# Patient Record
Sex: Female | Born: 1965
Health system: Southern US, Community
[De-identification: ages and names within clinical notes are randomized; demographics above are authoritative.]

## PROBLEM LIST (undated history)

## (undated) DIAGNOSIS — E559 Vitamin D deficiency, unspecified: Secondary | ICD-10-CM

## (undated) DIAGNOSIS — F419 Anxiety disorder, unspecified: Secondary | ICD-10-CM

## (undated) DIAGNOSIS — F329 Major depressive disorder, single episode, unspecified: Secondary | ICD-10-CM

## (undated) DIAGNOSIS — G4700 Insomnia, unspecified: Secondary | ICD-10-CM

## (undated) DIAGNOSIS — F32A Depression, unspecified: Secondary | ICD-10-CM

## (undated) DIAGNOSIS — N941 Unspecified dyspareunia: Secondary | ICD-10-CM

## (undated) HISTORY — DX: Major depressive disorder, single episode, unspecified: F32.9

## (undated) HISTORY — DX: Depression, unspecified: F32.A

## (undated) HISTORY — DX: Unspecified dyspareunia: N94.10

## (undated) HISTORY — DX: Vitamin D deficiency, unspecified: E55.9

## (undated) HISTORY — DX: Anxiety disorder, unspecified: F41.9

## (undated) HISTORY — DX: Insomnia, unspecified: G47.00

---

## 2014-12-06 DIAGNOSIS — G4700 Insomnia, unspecified: Secondary | ICD-10-CM | POA: Insufficient documentation

## 2014-12-06 DIAGNOSIS — F419 Anxiety disorder, unspecified: Secondary | ICD-10-CM | POA: Insufficient documentation

## 2014-12-08 ENCOUNTER — Ambulatory Visit (INDEPENDENT_AMBULATORY_CARE_PROVIDER_SITE_OTHER): Payer: Self-pay | Admitting: Physician Assistant

## 2014-12-08 ENCOUNTER — Encounter: Payer: Self-pay | Admitting: Physician Assistant

## 2014-12-08 VITALS — BP 115/60 | HR 72 | Temp 97.7°F | Resp 16 | Wt 139.0 lb

## 2014-12-08 DIAGNOSIS — F419 Anxiety disorder, unspecified: Secondary | ICD-10-CM

## 2014-12-08 DIAGNOSIS — G4700 Insomnia, unspecified: Secondary | ICD-10-CM

## 2014-12-08 MED ORDER — TRAZODONE HCL 150 MG PO TABS: 150.0000 mg | ORAL_TABLET | Freq: Every day | ORAL | Status: DC

## 2014-12-08 MED ORDER — ESCITALOPRAM OXALATE 20 MG PO TABS: 20.0000 mg | ORAL_TABLET | Freq: Every day | ORAL | Status: DC

## 2014-12-08 NOTE — Progress Notes (Signed)
   Subjective:    Patient ID: Linda Ali, female    DOB: 12/17/1965, 49 y.o.   MRN: [id number]  Anxiety Presents for follow-up visit. Symptoms include insomnia and nervous/anxious behavior. Patient reports no chest pain, compulsions, confusion, decreased concentration, depressed mood, dizziness, dry mouth, excessive worry, feeling of choking, hyperventilation, impotence, irritability, malaise, muscle tension, nausea, obsessions, palpitations, panic, restlessness, shortness of breath or suicidal ideas. Symptoms occur rarely. The severity of symptoms is mild. The quality of sleep is good (with trazodone). Nighttime awakenings: none.   Compliance with medications is 76-100%.      Review of Systems  Constitutional: Negative for fever, chills, appetite change, irritability, fatigue and unexpected weight change.  Eyes: Negative for visual disturbance.  Respiratory: Negative for choking, chest tightness, shortness of breath and wheezing.   Cardiovascular: Negative for chest pain and palpitations.  Gastrointestinal: Negative for nausea and abdominal pain.  Genitourinary: Negative for impotence.  Neurological: Negative for dizziness, seizures, syncope, weakness, light-headedness and numbness.  Psychiatric/Behavioral: Negative for suicidal ideas, hallucinations, confusion, sleep disturbance, dysphoric mood, decreased concentration and agitation. The patient is nervous/anxious and has insomnia. The patient is not hyperactive.   All other systems reviewed and are negative.      Objective:   Physical Exam  Constitutional: She is oriented to person, place, and time. She appears well-developed and well-nourished. No distress.  Cardiovascular: Normal rate, regular rhythm, normal heart sounds and intact distal pulses.  Exam reveals no gallop and no friction rub.   No murmur heard. Pulmonary/Chest: Effort normal and breath sounds normal. No respiratory distress. She has no wheezes. She has no rales.  She exhibits no tenderness.  Neurological: She is alert and oriented to person, place, and time.  Skin: She is not diaphoretic.  Psychiatric: She has a normal mood and affect. Her behavior is normal. Judgment and thought content normal.  Vitals reviewed.         Assessment & Plan:  1. Anxiety Stable on lexapro.  Rx refilled.  Continune current medical plan - escitalopram (LEXAPRO) 20 MG tablet; Take 1 tablet (20 mg total) by mouth daily.  Dispense: 90 tablet; Refill: 3  2. Cannot sleep Stable and well controlled on trazadone.  Rx refilled.  COntinue current medical treatment plan. - traZODone (DESYREL) 150 MG tablet; Take 1 tablet (150 mg total) by mouth at bedtime.  Dispense: 90 tablet; Refill: 3

## 2014-12-08 NOTE — Patient Instructions (Signed)
Insomnia  Insomnia is frequent trouble falling and/or staying asleep. Insomnia can be a long term problem or a short term problem. Both are common. Insomnia can be a short term problem when the wakefulness is related to a certain stress or worry. Long term insomnia is often related to ongoing stress during waking hours and/or poor sleeping habits. Overtime, sleep deprivation itself can make the problem worse. Every little thing feels more severe because you are overtired and your ability to cope is decreased.  CAUSES    Stress, anxiety, and depression.   Poor sleeping habits.   Distractions such as TV in the bedroom.   Naps close to bedtime.   Engaging in emotionally charged conversations before bed.   Technical reading before sleep.   Alcohol and other sedatives. They may make the problem worse. They can hurt normal sleep patterns and normal dream activity.   Stimulants such as caffeine for several hours prior to bedtime.   Pain syndromes and shortness of breath can cause insomnia.   Exercise late at night.   Changing time zones may cause sleeping problems (jet lag).  It is sometimes helpful to have someone observe your sleeping patterns. They should look for periods of not breathing during the night (sleep apnea). They should also look to see how long those periods last. If you live alone or observers are uncertain, you can also be observed at a sleep clinic where your sleep patterns will be professionally monitored. Sleep apnea requires a checkup and treatment. Give your caregivers your medical history. Give your caregivers observations your family has made about your sleep.   SYMPTOMS    Not feeling rested in the morning.   Anxiety and restlessness at bedtime.   Difficulty falling and staying asleep.  TREATMENT    Your caregiver may prescribe treatment for an underlying medical disorders. Your caregiver can give advice or help if you are using alcohol or other drugs for self-medication. Treatment  of underlying problems will usually eliminate insomnia problems.   Medications can be prescribed for short time use. They are generally not recommended for lengthy use.   Over-the-counter sleep medicines are not recommended for lengthy use. They can be habit forming.   You can promote easier sleeping by making lifestyle changes such as:   Using relaxation techniques that help with breathing and reduce muscle tension.   Exercising earlier in the day.   Changing your diet and the time of your last meal. No night time snacks.   Establish a regular time to go to bed.   Counseling can help with stressful problems and worry.   Soothing music and white noise may be helpful if there are background noises you cannot remove.   Stop tedious detailed work at least one hour before bedtime.  HOME CARE INSTRUCTIONS    Keep a diary. Inform your caregiver about your progress. This includes any medication side effects. See your caregiver regularly. Take note of:   Times when you are asleep.   Times when you are awake during the night.   The quality of your sleep.   How you feel the next day.  This information will help your caregiver care for you.   Get out of bed if you are still awake after 15 minutes. Read or do some quiet activity. Keep the lights down. Wait until you feel sleepy and go back to bed.   Keep regular sleeping and waking hours. Avoid naps.   Exercise regularly.   Avoid distractions   at bedtime. Distractions include watching television or engaging in any intense or detailed activity like attempting to balance the household checkbook.   Develop a bedtime ritual. Keep a familiar routine of bathing, brushing your teeth, climbing into bed at the same time each night, listening to soothing music. Routines increase the success of falling to sleep faster.   Use relaxation techniques. This can be using breathing and muscle tension release routines. It can also include visualizing peaceful scenes. You can  also help control troubling or intruding thoughts by keeping your mind occupied with boring or repetitive thoughts like the old concept of counting sheep. You can make it more creative like imagining planting one beautiful flower after another in your backyard garden.   During your day, work to eliminate stress. When this is not possible use some of the previous suggestions to help reduce the anxiety that accompanies stressful situations.  MAKE SURE YOU:    Understand these instructions.   Will watch your condition.   Will get help right away if you are not doing well or get worse.  Document Released: 05/30/2000 Document Revised: 08/25/2011 Document Reviewed: 06/30/2007  ExitCare Patient Information 2015 [employer]. This information is not intended to replace advice given to you by your health care provider. Make sure you discuss any questions you have with your health care provider.  Generalized Anxiety Disorder  Generalized anxiety disorder (GAD) is a mental disorder. It interferes with life functions, including relationships, work, and school.  GAD is different from normal anxiety, which everyone experiences at some point in their lives in response to specific life events and activities. Normal anxiety actually helps us prepare for and get through these life events and activities. Normal anxiety goes away after the event or activity is over.   GAD causes anxiety that is not necessarily related to specific events or activities. It also causes excess anxiety in proportion to specific events or activities. The anxiety associated with GAD is also difficult to control. GAD can vary from mild to severe. People with severe GAD can have intense waves of anxiety with physical symptoms (panic attacks).   SYMPTOMS  The anxiety and worry associated with GAD are difficult to control. This anxiety and worry are related to many life events and activities and also occur more days than not for 6 months or longer. People  with GAD also have three or more of the following symptoms (one or more in children):   Restlessness.    Fatigue.   Difficulty concentrating.    Irritability.   Muscle tension.   Difficulty sleeping or unsatisfying sleep.  DIAGNOSIS  GAD is diagnosed through an assessment by your health care provider. Your health care provider will ask you questions aboutyour mood,physical symptoms, and events in your life. Your health care provider may ask you about your medical history and use of alcohol or drugs, including prescription medicines. Your health care provider may also do a physical exam and blood tests. Certain medical conditions and the use of certain substances can cause symptoms similar to those associated with GAD. Your health care provider may refer you to a mental health specialist for further evaluation.  TREATMENT  The following therapies are usually used to treat GAD:    Medication. Antidepressant medication usually is prescribed for long-term daily control. Antianxiety medicines may be added in severe cases, especially when panic attacks occur.    Talk therapy (psychotherapy). Certain types of talk therapy can be helpful in treating GAD by providing   support, education, and guidance. A form of talk therapy called cognitive behavioral therapy can teach you healthy ways to think about and react to daily life events and activities.   Stress managementtechniques. These include yoga, meditation, and exercise and can be very helpful when they are practiced regularly.  A mental health specialist can help determine which treatment is best for you. Some people see improvement with one therapy. However, other people require a combination of therapies.  Document Released: 09/27/2012 Document Revised: 10/17/2013 Document Reviewed: 09/27/2012  ExitCare Patient Information 2015 [employer]. This information is not intended to replace advice given to you by your health care provider. Make sure you  discuss any questions you have with your health care provider.

## 2014-12-29 ENCOUNTER — Ambulatory Visit: Payer: Self-pay | Admitting: Obstetrics and Gynecology

## 2015-01-18 ENCOUNTER — Encounter: Payer: Self-pay | Admitting: Obstetrics and Gynecology

## 2015-07-20 ENCOUNTER — Telehealth: Payer: Self-pay | Admitting: Physician Assistant

## 2015-07-20 DIAGNOSIS — G4700 Insomnia, unspecified: Secondary | ICD-10-CM

## 2015-07-20 NOTE — Telephone Encounter (Signed)
Pt called saying the pharmacy [facility] [employer] called her and told her that her last 63 of the trazadone had been denied and that she needed to call her primary doctor.  She doesn't understand why .  She said that seemed weird to her because she had just picked up 30 days yesterday.  Please call pt at [phone]  Thanks teri

## 2015-07-21 NOTE — Telephone Encounter (Signed)
Should we call the pharmacy? Please advise. Thanks!

## 2015-07-23 MED ORDER — TRAZODONE HCL 150 MG PO TABS: 150.0000 mg | ORAL_TABLET | Freq: Every day | ORAL | Status: DC

## 2015-07-23 NOTE — Telephone Encounter (Signed)
Rx sent in

## 2015-07-23 NOTE — Telephone Encounter (Signed)
Advised patient as below.  

## 2015-11-28 ENCOUNTER — Telehealth: Payer: Self-pay | Admitting: Physician Assistant

## 2015-11-28 DIAGNOSIS — G4700 Insomnia, unspecified: Secondary | ICD-10-CM

## 2015-11-28 MED ORDER — TRAZODONE HCL 150 MG PO TABS
150.0000 mg | ORAL_TABLET | Freq: Every day | ORAL | Status: DC
Start: 2015-11-28 — End: 2015-12-05

## 2015-11-28 NOTE — Telephone Encounter (Signed)
Please review.  Thanks,  -Treven Holtman 

## 2015-11-28 NOTE — Telephone Encounter (Signed)
Sent in Rx for one month to [facility].

## 2015-11-28 NOTE — Telephone Encounter (Signed)
Pt states she is out of traZODone (DESYREL) 150 MG tablet.  She made an appt for 6/21 and would like to know if you would call in an RX to hold her over until her appt.

## 2015-12-05 ENCOUNTER — Encounter: Payer: Self-pay | Admitting: Physician Assistant

## 2015-12-05 ENCOUNTER — Ambulatory Visit (INDEPENDENT_AMBULATORY_CARE_PROVIDER_SITE_OTHER): Payer: BLUE CROSS/BLUE SHIELD | Admitting: Physician Assistant

## 2015-12-05 VITALS — BP 110/80 | HR 71 | Temp 98.2°F | Resp 16 | Wt 149.4 lb

## 2015-12-05 DIAGNOSIS — J069 Acute upper respiratory infection, unspecified: Secondary | ICD-10-CM

## 2015-12-05 DIAGNOSIS — K120 Recurrent oral aphthae: Secondary | ICD-10-CM

## 2015-12-05 DIAGNOSIS — G4700 Insomnia, unspecified: Secondary | ICD-10-CM

## 2015-12-05 DIAGNOSIS — F411 Generalized anxiety disorder: Secondary | ICD-10-CM | POA: Diagnosis not present

## 2015-12-05 MED ORDER — TRAZODONE HCL 150 MG PO TABS: 150.0000 mg | ORAL_TABLET | Freq: Every day | ORAL | Status: DC

## 2015-12-05 MED ORDER — AMOXICILLIN 400 MG/5ML PO SUSR: 400.0000 mg | Freq: Two times a day (BID) | ORAL | Status: DC

## 2015-12-05 MED ORDER — SERTRALINE HCL 50 MG PO TABS: 50.0000 mg | ORAL_TABLET | Freq: Every day | ORAL | Status: DC

## 2015-12-05 NOTE — Patient Instructions (Signed)
Sertraline tablets What is this medicine? SERTRALINE (SER tra leen) is used to treat depression. It may also be used to treat obsessive compulsive disorder, panic disorder, post-trauma stress, premenstrual dysphoric disorder (PMDD) or social anxiety. This medicine may be used for other purposes; ask your health care provider or pharmacist if you have questions. What should I tell my health care provider before I take this medicine? They need to know if you have any of these conditions: -bipolar disorder or a family history of bipolar disorder -diabetes -glaucoma -heart disease -high blood pressure -history of irregular heartbeat -history of low levels of calcium, magnesium, or potassium in the blood -if you often drink alcohol -liver disease -receiving electroconvulsive therapy -seizures -suicidal thoughts, plans, or attempt; a previous suicide attempt by you or a family member -thyroid disease -an unusual or allergic reaction to sertraline, other medicines, foods, dyes, or preservatives -pregnant or trying to get pregnant -breast-feeding How should I use this medicine? Take this medicine by mouth with a glass of water. Follow the directions on the prescription label. You can take it with or without food. Take your medicine at regular intervals. Do not take your medicine more often than directed. Do not stop taking this medicine suddenly except upon the advice of your doctor. Stopping this medicine too quickly may cause serious side effects or your condition may worsen. A special MedGuide will be given to you by the pharmacist with each prescription and refill. Be sure to read this information carefully each time. Talk to your pediatrician regarding the use of this medicine in children. While this drug may be prescribed for children as young as 7 years for selected conditions, precautions do apply. Overdosage: If you think you have taken too much of this medicine contact a poison control  center or emergency room at once. NOTE: This medicine is only for you. Do not share this medicine with others. What if I miss a dose? If you miss a dose, take it as soon as you can. If it is almost time for your next dose, take only that dose. Do not take double or extra doses. What may interact with this medicine? Do not take this medicine with any of the following medications: -certain medicines for fungal infections like fluconazole, itraconazole, ketoconazole, posaconazole, voriconazole -cisapride -disulfiram -dofetilide -linezolid -MAOIs like Carbex, Eldepryl, Marplan, Nardil, and Parnate -metronidazole -methylene blue (injected into a vein) -pimozide -thioridazine -ziprasidone This medicine may also interact with the following medications: -alcohol -aspirin and aspirin-like medicines -certain medicines for depression, anxiety, or psychotic disturbances -certain medicines for irregular heart beat like flecainide, propafenone -certain medicines for migraine headaches like almotriptan, eletriptan, frovatriptan, naratriptan, rizatriptan, sumatriptan, zolmitriptan -certain medicines for sleep -certain medicines for seizures like carbamazepine, valproic acid, phenytoin -certain medicines that treat or prevent blood clots like warfarin, enoxaparin, dalteparin -cimetidine -digoxin -diuretics -fentanyl -furazolidone -isoniazid -lithium -NSAIDs, medicines for pain and inflammation, like ibuprofen or naproxen -other medicines that prolong the QT interval (cause an abnormal heart rhythm) -procarbazine -rasagiline -supplements like St. John's wort, kava kava, valerian -tolbutamide -tramadol -tryptophan This list may not describe all possible interactions. Give your health care provider a list of all the medicines, herbs, non-prescription drugs, or dietary supplements you use. Also tell them if you smoke, drink alcohol, or use illegal drugs. Some items may interact with your  medicine. What should I watch for while using this medicine? Tell your doctor if your symptoms do not get better or if they get worse. Visit your doctor   or health care professional for regular checks on your progress. Because it may take several weeks to see the full effects of this medicine, it is important to continue your treatment as prescribed by your doctor. Patients and their families should watch out for new or worsening thoughts of suicide or depression. Also watch out for sudden changes in feelings such as feeling anxious, agitated, panicky, irritable, hostile, aggressive, impulsive, severely restless, overly excited and hyperactive, or not being able to sleep. If this happens, especially at the beginning of treatment or after a change in dose, call your health care professional. You may get drowsy or dizzy. Do not drive, use machinery, or do anything that needs mental alertness until you know how this medicine affects you. Do not stand or sit up quickly, especially if you are an older patient. This reduces the risk of dizzy or fainting spells. Alcohol may interfere with the effect of this medicine. Avoid alcoholic drinks. Your mouth may get dry. Chewing sugarless gum or sucking hard candy, and drinking plenty of water may help. Contact your doctor if the problem does not go away or is severe. What side effects may I notice from receiving this medicine? Side effects that you should report to your doctor or health care professional as soon as possible: -allergic reactions like skin rash, itching or hives, swelling of the face, lips, or tongue -black or bloody stools, blood in the urine or vomit -fast, irregular heartbeat -feeling faint or lightheaded, falls -hallucination, loss of contact with reality -seizures -suicidal thoughts or other mood changes -unusual bleeding or bruising -unusually weak or tired -vomiting Side effects that usually do not require medical attention (report to your  doctor or health care professional if they continue or are bothersome): -change in appetite -change in sex drive or performance -diarrhea -increased sweating -indigestion, nausea -tremors This list may not describe all possible side effects. Call your doctor for medical advice about side effects. You may report side effects to FDA at 1-800-FDA-1088. Where should I keep my medicine? Keep out of the reach of children. Store at room temperature between 15 and 30 degrees C (59 and 86 degrees F). Throw away any unused medicine after the expiration date. NOTE: This sheet is a summary. It may not cover all possible information. If you have questions about this medicine, talk to your doctor, pharmacist, or health care provider.    2016, Elsevier/Gold Standard. (2012-12-28 12:57:35)  

## 2015-12-05 NOTE — Progress Notes (Signed)
Patient: Linda Ali Female    DOB: 1966-04-25   50 y.o.   MRN: [id number] Visit Date: 12/05/2015  Today's Provider: Margaretann Loveless, PA-C   Chief Complaint  Patient presents with  . Follow-up    Refill on Trazodone   Subjective:    HPI Cannot Sleep: She is here to follow-up on her sleep. She is doing well with the Trazodone and needs refill.  Ulcers: She reports that she being having this cold for a month is better than what it was but she feels very tired. She also has some ulcers in her mouth and her throat hurts and she is not sure if it is from the cold. She has also been under more stress recently with her parents moving down here from [place]. They are both in their 29s and they are also building them a condo here as well, adding to the stress.    No Known Allergies Current Meds  Medication Sig  . MULTIPLE VITAMIN PO Take 1 tablet by mouth daily.  . traZODone (DESYREL) 150 MG tablet Take 1 tablet (150 mg total) by mouth at bedtime.    Review of Systems  Constitutional: Positive for fever (off and on), chills and fatigue.  HENT: Positive for congestion, mouth sores, postnasal drip, rhinorrhea, sinus pressure, sneezing and sore throat (left side for past couple days). Negative for ear pain, tinnitus, trouble swallowing and voice change.   Respiratory: Positive for cough. Negative for chest tightness, shortness of breath and wheezing.   Cardiovascular: Negative for chest pain, palpitations and leg swelling.  Gastrointestinal: Negative for nausea, vomiting and abdominal pain.  Neurological: Positive for headaches. Negative for dizziness.  Psychiatric/Behavioral: Positive for sleep disturbance.    Social History  Substance Use Topics  . Smoking status: Never Smoker   . Smokeless tobacco: Not on file  . Alcohol Use: 0.0 oz/week    0 Standard drinks or equivalent per week     Comment: OCCASIONALLY   Objective:   BP 110/80 mmHg  Pulse 71  Temp(Src) 98.2 F (36.8  C) (Oral)  Resp 16  Wt 149 lb 6.4 oz (67.767 kg)  SpO2 98%  Physical Exam  Constitutional: She appears well-developed and well-nourished. No distress.  HENT:  Head: Normocephalic and atraumatic.  Right Ear: Hearing, tympanic membrane, external ear and ear canal normal.  Left Ear: Hearing, tympanic membrane, external ear and ear canal normal.  Nose: Mucosal edema present. No rhinorrhea. Right sinus exhibits no maxillary sinus tenderness and no frontal sinus tenderness. Left sinus exhibits no maxillary sinus tenderness and no frontal sinus tenderness.  Mouth/Throat: Uvula is midline and mucous membranes are normal. Oral lesions present. No uvula swelling. Posterior oropharyngeal erythema present. No oropharyngeal exudate or posterior oropharyngeal edema.    Eyes: Conjunctivae are normal. Pupils are equal, round, and reactive to light. Right eye exhibits no discharge. Left eye exhibits no discharge. No scleral icterus.  Neck: Normal range of motion. Neck supple. No JVD present. No tracheal deviation present. No thyromegaly present.  Cardiovascular: Normal rate, regular rhythm and normal heart sounds.  Exam reveals no gallop and no friction rub.   No murmur heard. Pulmonary/Chest: Effort normal and breath sounds normal. No stridor. No respiratory distress. She has no wheezes. She has no rales.  Lymphadenopathy:    She has no cervical adenopathy.  Skin: Skin is warm and dry. She is not diaphoretic.  Vitals reviewed.       Assessment & Plan:  1. GAD (generalized anxiety disorder) Lexapro started making her sick so will switch back to sertraline as she has used this in the past with success. She is to call if she does not tolerate this medicine or if she has any adverse reaction.  - sertraline (ZOLOFT) 50 MG tablet; Take 1 tablet (50 mg total) by mouth daily.  Dispense: 30 tablet; Refill: 3  2. Cannot sleep Stable. Diagnosis pulled for medication refill. Continue current medical  treatment plan. - traZODone (DESYREL) 150 MG tablet; Take 1 tablet (150 mg total) by mouth at bedtime.  Dispense: 30 tablet; Refill: 5  3. Upper respiratory infection URI that keeps coming and going with an aphthous ulcer that may be stress induced. Will give amoxicillin suspension as below to treat both. She may use orajel over area if needed to lessen pain. She is to call if symptoms fail to improve or worsen. - amoxicillin (AMOXIL) 400 MG/5ML suspension; Take 5 mLs (400 mg total) by mouth 2 (two) times daily.  Dispense: 100 mL; Refill: 0  4. Aphthous ulcer See above medical treatment plan. - amoxicillin (AMOXIL) 400 MG/5ML suspension; Take 5 mLs (400 mg total) by mouth 2 (two) times daily.  Dispense: 100 mL; Refill: 0       Margaretann LovelessJennifer M Percilla Tweten, PA-C  [facility]

## 2016-03-06 ENCOUNTER — Encounter: Payer: Self-pay | Admitting: Obstetrics and Gynecology

## 2016-03-06 ENCOUNTER — Other Ambulatory Visit: Payer: Self-pay | Admitting: Obstetrics and Gynecology

## 2016-03-06 ENCOUNTER — Ambulatory Visit (INDEPENDENT_AMBULATORY_CARE_PROVIDER_SITE_OTHER): Payer: BLUE CROSS/BLUE SHIELD | Admitting: Obstetrics and Gynecology

## 2016-03-06 VITALS — BP 119/78 | HR 96 | Ht 65.0 in | Wt 146.9 lb

## 2016-03-06 DIAGNOSIS — Z01419 Encounter for gynecological examination (general) (routine) without abnormal findings: Secondary | ICD-10-CM

## 2016-03-06 DIAGNOSIS — G4700 Insomnia, unspecified: Secondary | ICD-10-CM

## 2016-03-06 MED ORDER — PHENTERMINE HCL 37.5 MG PO TABS: 37.5000 mg | ORAL_TABLET | Freq: Every day | ORAL | 1 refills | Status: DC

## 2016-03-06 MED ORDER — TRAZODONE HCL 150 MG PO TABS: 150.0000 mg | ORAL_TABLET | Freq: Every day | ORAL | 5 refills | Status: DC

## 2016-03-06 MED ORDER — ESTRADIOL 0.1 MG/GM VA CREA: 2.0000 g | TOPICAL_CREAM | Freq: Every day | VAGINAL | 2 refills | Status: DC

## 2016-03-06 NOTE — Patient Instructions (Signed)
Preventive Care for Adults, Female A healthy lifestyle and preventive care can promote health and wellness. Preventive health guidelines for women include the following key practices.  A routine yearly physical is a good way to check with your health care provider about your health and preventive screening. It is a chance to share any concerns and updates on your health and to receive a thorough exam.  Visit your dentist for a routine exam and preventive care every 6 months. Brush your teeth twice a day and floss once a day. Good oral hygiene prevents tooth decay and gum disease.  The frequency of eye exams is based on your age, health, family medical history, use of contact lenses, and other factors. Follow your health care provider's recommendations for frequency of eye exams.  Eat a healthy diet. Foods like vegetables, fruits, whole grains, low-fat dairy products, and lean protein foods contain the nutrients you need without too many calories. Decrease your intake of foods high in solid fats, added sugars, and salt. Eat the right amount of calories for you.Get information about a proper diet from your health care provider, if necessary.  Regular physical exercise is one of the most important things you can do for your health. Most adults should get at least 150 minutes of moderate-intensity exercise (any activity that increases your heart rate and causes you to sweat) each week. In addition, most adults need muscle-strengthening exercises on 2 or more days a week.  Maintain a healthy weight. The body mass index (BMI) is a screening tool to identify possible weight problems. It provides an estimate of body fat based on height and weight. Your health care provider can find your BMI and can help you achieve or maintain a healthy weight.For adults 20 years and older:  A BMI below 18.5 is considered underweight.  A BMI of 18.5 to 24.9 is normal.  A BMI of 25 to 29.9 is considered  overweight.  A BMI of 30 and above is considered obese.  Maintain normal blood lipids and cholesterol levels by exercising and minimizing your intake of saturated fat. Eat a balanced diet with plenty of fruit and vegetables. Blood tests for lipids and cholesterol should begin at age 64 and be repeated every 5 years. If your lipid or cholesterol levels are high, you are over 50, or you are at high risk for heart disease, you may need your cholesterol levels checked more frequently.Ongoing high lipid and cholesterol levels should be treated with medicines if diet and exercise are not working.  If you smoke, find out from your health care provider how to quit. If you do not use tobacco, do not start.  Lung cancer screening is recommended for adults aged 52-80 years who are at high risk for developing lung cancer because of a history of smoking. A yearly low-dose CT scan of the lungs is recommended for people who have at least a 30-pack-year history of smoking and are a current smoker or have quit within the past 15 years. A pack year of smoking is smoking an average of 1 pack of cigarettes a day for 1 year (for example: 1 pack a day for 30 years or 2 packs a day for 15 years). Yearly screening should continue until the smoker has stopped smoking for at least 15 years. Yearly screening should be stopped for people who develop a health problem that would prevent them from having lung cancer treatment.  If you are pregnant, do not drink alcohol. If you are  breastfeeding, be very cautious about drinking alcohol. If you are not pregnant and choose to drink alcohol, do not have more than 1 drink per day. One drink is considered to be 12 ounces (355 mL) of beer, 5 ounces (148 mL) of wine, or 1.5 ounces (44 mL) of liquor.  Avoid use of street drugs. Do not share needles with anyone. Ask for help if you need support or instructions about stopping the use of drugs.  High blood pressure causes heart disease and  increases the risk of stroke. Your blood pressure should be checked at least every 1 to 2 years. Ongoing high blood pressure should be treated with medicines if weight loss and exercise do not work.  If you are 25-78 years old, ask your health care provider if you should take aspirin to prevent strokes.  Diabetes screening is done by taking a blood sample to check your blood glucose level after you have not eaten for a certain period of time (fasting). If you are not overweight and you do not have risk factors for diabetes, you should be screened once every 3 years starting at age 86. If you are overweight or obese and you are 3-87 years of age, you should be screened for diabetes every year as part of your cardiovascular risk assessment.  Breast cancer screening is essential preventive care for women. You should practice "breast self-awareness." This means understanding the normal appearance and feel of your breasts and may include breast self-examination. Any changes detected, no matter how small, should be reported to a health care provider. Women in their 66s and 30s should have a clinical breast exam (CBE) by a health care provider as part of a regular health exam every 1 to 3 years. After age 43, women should have a CBE every year. Starting at age 37, women should consider having a mammogram (breast X-ray test) every year. Women who have a family history of breast cancer should talk to their health care provider about genetic screening. Women at a high risk of breast cancer should talk to their health care providers about having an MRI and a mammogram every year.  Breast cancer gene (BRCA)-related cancer risk assessment is recommended for women who have family members with BRCA-related cancers. BRCA-related cancers include breast, ovarian, tubal, and peritoneal cancers. Having family members with these cancers may be associated with an increased risk for harmful changes (mutations) in the breast  cancer genes BRCA1 and BRCA2. Results of the assessment will determine the need for genetic counseling and BRCA1 and BRCA2 testing.  Your health care provider may recommend that you be screened regularly for cancer of the pelvic organs (ovaries, uterus, and vagina). This screening involves a pelvic examination, including checking for microscopic changes to the surface of your cervix (Pap test). You may be encouraged to have this screening done every 3 years, beginning at age 78.  For women ages 79-65, health care providers may recommend pelvic exams and Pap testing every 3 years, or they may recommend the Pap and pelvic exam, combined with testing for human papilloma virus (HPV), every 5 years. Some types of HPV increase your risk of cervical cancer. Testing for HPV may also be done on women of any age with unclear Pap test results.  Other health care providers may not recommend any screening for nonpregnant women who are considered low risk for pelvic cancer and who do not have symptoms. Ask your health care provider if a screening pelvic exam is right for  you.  If you have had past treatment for cervical cancer or a condition that could lead to cancer, you need Pap tests and screening for cancer for at least 20 years after your treatment. If Pap tests have been discontinued, your risk factors (such as having a new sexual partner) need to be reassessed to determine if screening should resume. Some women have medical problems that increase the chance of getting cervical cancer. In these cases, your health care provider may recommend more frequent screening and Pap tests.  Colorectal cancer can be detected and often prevented. Most routine colorectal cancer screening begins at the age of 50 years and continues through age 75 years. However, your health care provider may recommend screening at an earlier age if you have risk factors for colon cancer. On a yearly basis, your health care provider may provide  home test kits to check for hidden blood in the stool. Use of a small camera at the end of a tube, to directly examine the colon (sigmoidoscopy or colonoscopy), can detect the earliest forms of colorectal cancer. Talk to your health care provider about this at age 50, when routine screening begins. Direct exam of the colon should be repeated every 5-10 years through age 75 years, unless early forms of precancerous polyps or small growths are found.  People who are at an increased risk for hepatitis B should be screened for this virus. You are considered at high risk for hepatitis B if:  You were born in a country where hepatitis B occurs often. Talk with your health care provider about which countries are considered high risk.  Your parents were born in a high-risk country and you have not received a shot to protect against hepatitis B (hepatitis B vaccine).  You have HIV or AIDS.  You use needles to inject street drugs.  You live with, or have sex with, someone who has hepatitis B.  You get hemodialysis treatment.  You take certain medicines for conditions like cancer, organ transplantation, and autoimmune conditions.  Hepatitis C blood testing is recommended for all people born from 1945 through 1965 and any individual with known risks for hepatitis C.  Practice safe sex. Use condoms and avoid high-risk sexual practices to reduce the spread of sexually transmitted infections (STIs). STIs include gonorrhea, chlamydia, syphilis, trichomonas, herpes, HPV, and human immunodeficiency virus (HIV). Herpes, HIV, and HPV are viral illnesses that have no cure. They can result in disability, cancer, and death.  You should be screened for sexually transmitted illnesses (STIs) including gonorrhea and chlamydia if:  You are sexually active and are younger than 24 years.  You are older than 24 years and your health care provider tells you that you are at risk for this type of infection.  Your sexual  activity has changed since you were last screened and you are at an increased risk for chlamydia or gonorrhea. Ask your health care provider if you are at risk.  If you are at risk of being infected with HIV, it is recommended that you take a prescription medicine daily to prevent HIV infection. This is called preexposure prophylaxis (PrEP). You are considered at risk if:  You are sexually active and do not regularly use condoms or know the HIV status of your partner(s).  You take drugs by injection.  You are sexually active with a partner who has HIV.  Talk with your health care provider about whether you are at high risk of being infected with HIV. If   you choose to begin PrEP, you should first be tested for HIV. You should then be tested every 3 months for as long as you are taking PrEP.  Osteoporosis is a disease in which the bones lose minerals and strength with aging. This can result in serious bone fractures or breaks. The risk of osteoporosis can be identified using a bone density scan. Women ages 1 years and over and women at risk for fractures or osteoporosis should discuss screening with their health care providers. Ask your health care provider whether you should take a calcium supplement or vitamin D to reduce the rate of osteoporosis.  Menopause can be associated with physical symptoms and risks. Hormone replacement therapy is available to decrease symptoms and risks. You should talk to your health care provider about whether hormone replacement therapy is right for you.  Use sunscreen. Apply sunscreen liberally and repeatedly throughout the day. You should seek shade when your shadow is shorter than you. Protect yourself by wearing long sleeves, pants, a wide-brimmed hat, and sunglasses year round, whenever you are outdoors.  Once a month, do a whole body skin exam, using a mirror to look at the skin on your back. Tell your health care provider of new moles, moles that have irregular  borders, moles that are larger than a pencil eraser, or moles that have changed in shape or color.  Stay current with required vaccines (immunizations).  Influenza vaccine. All adults should be immunized every year.  Tetanus, diphtheria, and acellular pertussis (Td, Tdap) vaccine. Pregnant women should receive 1 dose of Tdap vaccine during each pregnancy. The dose should be obtained regardless of the length of time since the last dose. Immunization is preferred during the 27th-36th week of gestation. An adult who has not previously received Tdap or who does not know her vaccine status should receive 1 dose of Tdap. This initial dose should be followed by tetanus and diphtheria toxoids (Td) booster doses every 10 years. Adults with an unknown or incomplete history of completing a 3-dose immunization series with Td-containing vaccines should begin or complete a primary immunization series including a Tdap dose. Adults should receive a Td booster every 10 years.  Varicella vaccine. An adult without evidence of immunity to varicella should receive 2 doses or a second dose if she has previously received 1 dose. Pregnant females who do not have evidence of immunity should receive the first dose after pregnancy. This first dose should be obtained before leaving the health care facility. The second dose should be obtained 4-8 weeks after the first dose.  Human papillomavirus (HPV) vaccine. Females aged 13-26 years who have not received the vaccine previously should obtain the 3-dose series. The vaccine is not recommended for use in pregnant females. However, pregnancy testing is not needed before receiving a dose. If a female is found to be pregnant after receiving a dose, no treatment is needed. In that case, the remaining doses should be delayed until after the pregnancy. Immunization is recommended for any person with an immunocompromised condition through the age of 24 years if she did not get any or all doses  earlier. During the 3-dose series, the second dose should be obtained 4-8 weeks after the first dose. The third dose should be obtained 24 weeks after the first dose and 16 weeks after the second dose.  Zoster vaccine. One dose is recommended for adults aged 97 years or older unless certain conditions are present.  Measles, mumps, and rubella (MMR) vaccine. Adults born  before 1957 generally are considered immune to measles and mumps. Adults born in [birth year] or later should have 1 or more doses of MMR vaccine unless there is a contraindication to the vaccine or there is laboratory evidence of immunity to each of the three diseases. A routine second dose of MMR vaccine should be obtained at least 28 days after the first dose for students attending postsecondary schools, health care workers, or international travelers. People who received inactivated measles vaccine or an unknown type of measles vaccine during 1963-1967 should receive 2 doses of MMR vaccine. People who received inactivated mumps vaccine or an unknown type of mumps vaccine before 1979 and are at high risk for mumps infection should consider immunization with 2 doses of MMR vaccine. For females of childbearing age, rubella immunity should be determined. If there is no evidence of immunity, females who are not pregnant should be vaccinated. If there is no evidence of immunity, females who are pregnant should delay immunization until after pregnancy. Unvaccinated health care workers born before 60 who lack laboratory evidence of measles, mumps, or rubella immunity or laboratory confirmation of disease should consider measles and mumps immunization with 2 doses of MMR vaccine or rubella immunization with 1 dose of MMR vaccine.  Pneumococcal 13-valent conjugate (PCV13) vaccine. When indicated, a person who is uncertain of his immunization history and has no record of immunization should receive the PCV13 vaccine. All adults 61 years of age and older  should receive this vaccine. An adult aged 92 years or older who has certain medical conditions and has not been previously immunized should receive 1 dose of PCV13 vaccine. This PCV13 should be followed with a dose of pneumococcal polysaccharide (PPSV23) vaccine. Adults who are at high risk for pneumococcal disease should obtain the PPSV23 vaccine at least 8 weeks after the dose of PCV13 vaccine. Adults older than 51 years of age who have normal immune system function should obtain the PPSV23 vaccine dose at least 1 year after the dose of PCV13 vaccine.  Pneumococcal polysaccharide (PPSV23) vaccine. When PCV13 is also indicated, PCV13 should be obtained first. All adults aged 2 years and older should be immunized. An adult younger than age 30 years who has certain medical conditions should be immunized. Any person who resides in a nursing home or long-term care facility should be immunized. An adult smoker should be immunized. People with an immunocompromised condition and certain other conditions should receive both PCV13 and PPSV23 vaccines. People with human immunodeficiency virus (HIV) infection should be immunized as soon as possible after diagnosis. Immunization during chemotherapy or radiation therapy should be avoided. Routine use of PPSV23 vaccine is not recommended for American Indians, [place] Natives, or people younger than 65 years unless there are medical conditions that require PPSV23 vaccine. When indicated, people who have unknown immunization and have no record of immunization should receive PPSV23 vaccine. One-time revaccination 5 years after the first dose of PPSV23 is recommended for people aged 19-64 years who have chronic kidney failure, nephrotic syndrome, asplenia, or immunocompromised conditions. People who received 1-2 doses of PPSV23 before age 44 years should receive another dose of PPSV23 vaccine at age 83 years or later if at least 5 years have passed since the previous dose. Doses  of PPSV23 are not needed for people immunized with PPSV23 at or after age 20 years.  Meningococcal vaccine. Adults with asplenia or persistent complement component deficiencies should receive 2 doses of quadrivalent meningococcal conjugate (MenACWY-D) vaccine. The doses should be obtained  at least 2 months apart. Microbiologists working with certain meningococcal bacteria, [place] recruits, people at risk during an outbreak, and people who travel to or live in countries with a high rate of meningitis should be immunized. A first-year college student up through age 28 years who is living in a residence hall should receive a dose if she did not receive a dose on or after her 16th birthday. Adults who have certain high-risk conditions should receive one or more doses of vaccine.  Hepatitis A vaccine. Adults who wish to be protected from this disease, have certain high-risk conditions, work with hepatitis A-infected animals, work in hepatitis A research labs, or travel to or work in countries with a high rate of hepatitis A should be immunized. Adults who were previously unvaccinated and who anticipate close contact with an international adoptee during the first 60 days after arrival in the [place] from a country with a high rate of hepatitis A should be immunized.  Hepatitis B vaccine. Adults who wish to be protected from this disease, have certain high-risk conditions, may be exposed to blood or other infectious body fluids, are household contacts or sex partners of hepatitis B positive people, are clients or workers in certain care facilities, or travel to or work in countries with a high rate of hepatitis B should be immunized.  Haemophilus influenzae type b (Hib) vaccine. A previously unvaccinated person with asplenia or sickle cell disease or having a scheduled splenectomy should receive 1 dose of Hib vaccine. Regardless of previous immunization, a recipient of a hematopoietic stem cell transplant  should receive a 3-dose series 6-12 months after her successful transplant. Hib vaccine is not recommended for adults with HIV infection. Preventive Services / Frequency Ages 71 to 87 years  Blood pressure check.** / Every 3-5 years.  Lipid and cholesterol check.** / Every 5 years beginning at age 1.  Clinical breast exam.** / Every 3 years for women in their 3s and 31s.  BRCA-related cancer risk assessment.** / For women who have family members with a BRCA-related cancer (breast, ovarian, tubal, or peritoneal cancers).  Pap test.** / Every 2 years from ages 50 through 86. Every 3 years starting at age 87 through age 7 or 75 with a history of 3 consecutive normal Pap tests.  HPV screening.** / Every 3 years from ages 59 through ages 35 to 6 with a history of 3 consecutive normal Pap tests.  Hepatitis C blood test.** / For any individual with known risks for hepatitis C.  Skin self-exam. / Monthly.  Influenza vaccine. / Every year.  Tetanus, diphtheria, and acellular pertussis (Tdap, Td) vaccine.** / Consult your health care provider. Pregnant women should receive 1 dose of Tdap vaccine during each pregnancy. 1 dose of Td every 10 years.  Varicella vaccine.** / Consult your health care provider. Pregnant females who do not have evidence of immunity should receive the first dose after pregnancy.  HPV vaccine. / 3 doses over 6 months, if 72 and younger. The vaccine is not recommended for use in pregnant females. However, pregnancy testing is not needed before receiving a dose.  Measles, mumps, rubella (MMR) vaccine.** / You need at least 1 dose of MMR if you were born in 1957 or later. You may also need a 2nd dose. For females of childbearing age, rubella immunity should be determined. If there is no evidence of immunity, females who are not pregnant should be vaccinated. If there is no evidence of immunity, females who are  pregnant should delay immunization until after  pregnancy.  Pneumococcal 13-valent conjugate (PCV13) vaccine.** / Consult your health care provider.  Pneumococcal polysaccharide (PPSV23) vaccine.** / 1 to 2 doses if you smoke cigarettes or if you have certain conditions.  Meningococcal vaccine.** / 1 dose if you are age 87 to 44 years and a Market researcher living in a residence hall, or have one of several medical conditions, you need to get vaccinated against meningococcal disease. You may also need additional booster doses.  Hepatitis A vaccine.** / Consult your health care provider.  Hepatitis B vaccine.** / Consult your health care provider.  Haemophilus influenzae type b (Hib) vaccine.** / Consult your health care provider. Ages 86 to 38 years  Blood pressure check.** / Every year.  Lipid and cholesterol check.** / Every 5 years beginning at age 49 years.  Lung cancer screening. / Every year if you are aged 71-80 years and have a 30-pack-year history of smoking and currently smoke or have quit within the past 15 years. Yearly screening is stopped once you have quit smoking for at least 15 years or develop a health problem that would prevent you from having lung cancer treatment.  Clinical breast exam.** / Every year after age 51 years.  BRCA-related cancer risk assessment.** / For women who have family members with a BRCA-related cancer (breast, ovarian, tubal, or peritoneal cancers).  Mammogram.** / Every year beginning at age 18 years and continuing for as long as you are in good health. Consult with your health care provider.  Pap test.** / Every 3 years starting at age 63 years through age 37 or 57 years with a history of 3 consecutive normal Pap tests.  HPV screening.** / Every 3 years from ages 41 years through ages 76 to 23 years with a history of 3 consecutive normal Pap tests.  Fecal occult blood test (FOBT) of stool. / Every year beginning at age 36 years and continuing until age 51 years. You may not need  to do this test if you get a colonoscopy every 10 years.  Flexible sigmoidoscopy or colonoscopy.** / Every 5 years for a flexible sigmoidoscopy or every 10 years for a colonoscopy beginning at age 36 years and continuing until age 35 years.  Hepatitis C blood test.** / For all people born from [birth year] through 1966 and any individual with known risks for hepatitis C.  Skin self-exam. / Monthly.  Influenza vaccine. / Every year.  Tetanus, diphtheria, and acellular pertussis (Tdap/Td) vaccine.** / Consult your health care provider. Pregnant women should receive 1 dose of Tdap vaccine during each pregnancy. 1 dose of Td every 10 years.  Varicella vaccine.** / Consult your health care provider. Pregnant females who do not have evidence of immunity should receive the first dose after pregnancy.  Zoster vaccine.** / 1 dose for adults aged 73 years or older.  Measles, mumps, rubella (MMR) vaccine.** / You need at least 1 dose of MMR if you were born in 1957 or later. You may also need a second dose. For females of childbearing age, rubella immunity should be determined. If there is no evidence of immunity, females who are not pregnant should be vaccinated. If there is no evidence of immunity, females who are pregnant should delay immunization until after pregnancy.  Pneumococcal 13-valent conjugate (PCV13) vaccine.** / Consult your health care provider.  Pneumococcal polysaccharide (PPSV23) vaccine.** / 1 to 2 doses if you smoke cigarettes or if you have certain conditions.  Meningococcal vaccine.** /  Consult your health care provider.  Hepatitis A vaccine.** / Consult your health care provider.  Hepatitis B vaccine.** / Consult your health care provider.  Haemophilus influenzae type b (Hib) vaccine.** / Consult your health care provider. Ages 80 years and over  Blood pressure check.** / Every year.  Lipid and cholesterol check.** / Every 5 years beginning at age 62 years.  Lung cancer  screening. / Every year if you are aged 32-80 years and have a 30-pack-year history of smoking and currently smoke or have quit within the past 15 years. Yearly screening is stopped once you have quit smoking for at least 15 years or develop a health problem that would prevent you from having lung cancer treatment.  Clinical breast exam.** / Every year after age 61 years.  BRCA-related cancer risk assessment.** / For women who have family members with a BRCA-related cancer (breast, ovarian, tubal, or peritoneal cancers).  Mammogram.** / Every year beginning at age 39 years and continuing for as long as you are in good health. Consult with your health care provider.  Pap test.** / Every 3 years starting at age 85 years through age 74 or 72 years with 3 consecutive normal Pap tests. Testing can be stopped between 65 and 70 years with 3 consecutive normal Pap tests and no abnormal Pap or HPV tests in the past 10 years.  HPV screening.** / Every 3 years from ages 55 years through ages 67 or 77 years with a history of 3 consecutive normal Pap tests. Testing can be stopped between 65 and 70 years with 3 consecutive normal Pap tests and no abnormal Pap or HPV tests in the past 10 years.  Fecal occult blood test (FOBT) of stool. / Every year beginning at age 81 years and continuing until age 22 years. You may not need to do this test if you get a colonoscopy every 10 years.  Flexible sigmoidoscopy or colonoscopy.** / Every 5 years for a flexible sigmoidoscopy or every 10 years for a colonoscopy beginning at age 67 years and continuing until age 22 years.  Hepatitis C blood test.** / For all people born from [birth year] through 1966 and any individual with known risks for hepatitis C.  Osteoporosis screening.** / A one-time screening for women ages 8 years and over and women at risk for fractures or osteoporosis.  Skin self-exam. / Monthly.  Influenza vaccine. / Every year.  Tetanus, diphtheria, and  acellular pertussis (Tdap/Td) vaccine.** / 1 dose of Td every 10 years.  Varicella vaccine.** / Consult your health care provider.  Zoster vaccine.** / 1 dose for adults aged 56 years or older.  Pneumococcal 13-valent conjugate (PCV13) vaccine.** / Consult your health care provider.  Pneumococcal polysaccharide (PPSV23) vaccine.** / 1 dose for all adults aged 15 years and older.  Meningococcal vaccine.** / Consult your health care provider.  Hepatitis A vaccine.** / Consult your health care provider.  Hepatitis B vaccine.** / Consult your health care provider.  Haemophilus influenzae type b (Hib) vaccine.** / Consult your health care provider. ** Family history and personal history of risk and conditions may change your health care provider's recommendations.   This information is not intended to replace advice given to you by your health care provider. Make sure you discuss any questions you have with your health care provider.   Document Released: 07/29/2001 Document Revised: 06/23/2014 Document Reviewed: 10/28/2010 Elsevier Interactive Patient Education [employer].

## 2016-03-06 NOTE — Progress Notes (Signed)
Subjective:   Linda Ali is a 50 y.o. G82P0 Caucasian female here for a routine well-woman exam.  No LMP recorded. Patient is not currently having periods (Reason: Other).    Current complaints: needs meds refilled, does report persistent vaginal dryness and pain with intercourse PCP: Rosezetta Schlatter       does desire labs  Social History: Sexual: heterosexual Marital Status: divorced Living situation: with family Occupation: unknown occupation Tobacco/alcohol: no tobacco use Illicit drugs: no history of illicit drug use  The following portions of the patient's history were reviewed and updated as appropriate: allergies, current medications, past family history, past medical history, past social history, past surgical history and problem list.  Past Medical History Past Medical History:  Diagnosis Date  . Anxiety   . Depression   . Dyspareunia in female   . Insomnia   . Vitamin D deficiency     Past Surgical History Past Surgical History:  Procedure Laterality Date  . CESAREAN SECTION  1610,9604    Gynecologic History G2P0  No LMP recorded. Patient is not currently having periods (Reason: Other). Contraception: abstinence Last Pap: 2015. Results were: normal Last mammogram: 2016. Results were: normal   Obstetric History OB History  Gravida Para Term Preterm AB Living  2            SAB TAB Ectopic Multiple Live Births               # Outcome Date GA Lbr Len/2nd Weight Sex Delivery Anes PTL Lv  2 Gravida 1994    F CS-Unspec     1 Gravida 1992    M CS-Unspec         Current Medications Current Outpatient Prescriptions on File Prior to Visit  Medication Sig Dispense Refill  . amoxicillin (AMOXIL) 400 MG/5ML suspension Take 5 mLs (400 mg total) by mouth 2 (two) times daily. (Patient not taking: Reported on 03/06/2016) 100 mL 0  . MULTIPLE VITAMIN PO Take 1 tablet by mouth daily.    . sertraline (ZOLOFT) 50 MG tablet Take 1 tablet (50 mg total) by mouth daily. (Patient  not taking: Reported on 03/06/2016) 30 tablet 3   No current facility-administered medications on file prior to visit.     Review of Systems Patient denies any headaches, blurred vision, shortness of breath, chest pain, abdominal pain, problems with bowel movements, urination, or intercourse.  Objective:  BP 119/78   Pulse 96   Ht 5\' 5"  (1.651 m)   Wt 146 lb 14.4 oz (66.6 kg)   BMI 24.45 kg/m  Physical Exam  General:  Well developed, well nourished, no acute distress. She is alert and oriented x3. Skin:  Warm and dry Neck:  Midline trachea, no thyromegaly or nodules Cardiovascular: Regular rate and rhythm, no murmur heard Lungs:  Effort normal, all lung fields clear to auscultation bilaterally Breasts:  No dominant palpable mass, retraction, or nipple discharge Abdomen:  Soft, non tender, no hepatosplenomegaly or masses Pelvic:  External genitalia is normal in appearance.  The vagina is normal in appearance, but atrophic. The cervix is normal but atrophic. Thin prep pap is not done . Uterus is felt to be normal size, shape, and contour.  No adnexal masses or tenderness noted. Extremities:  No swelling or varicosities noted Psych:  She has a normal mood and affect  Assessment:   Healthy well-woman exam Vaginal atrophy dysparenia   Plan:  meds refilled labs obtained Added Estrace daily and then prn  F/U 1  year for AE, or sooner if needed Mammogram ordered  Tyrone Balash Suzan NailerN Maeve Debord, CNM

## 2016-03-07 LAB — COMPREHENSIVE METABOLIC PANEL
A/G RATIO: 1.7 (ref 1.2–2.2)
ALK PHOS: 62 IU/L (ref 39–117)
ALT: 26 IU/L (ref 0–32)
AST: 25 IU/L (ref 0–40)
Albumin: 4.5 g/dL (ref 3.5–5.5)
BUN / CREAT RATIO: 13 (ref 9–23)
BUN: 9 mg/dL (ref 6–24)
Bilirubin Total: 0.3 mg/dL (ref 0.0–1.2)
CHLORIDE: 100 mmol/L (ref 96–106)
CO2: 22 mmol/L (ref 18–29)
Calcium: 9.5 mg/dL (ref 8.7–10.2)
Creatinine, Ser: 0.68 mg/dL (ref 0.57–1.00)
GFR calc Af Amer: 117 mL/min/{1.73_m2} (ref >59)
GFR calc non Af Amer: 102 mL/min/{1.73_m2} (ref >59)
GLOBULIN, TOTAL: 2.7 g/dL (ref 1.5–4.5)
GLUCOSE: 66 mg/dL (ref 65–99)
POTASSIUM: 4.2 mmol/L (ref 3.5–5.2)
SODIUM: 140 mmol/L (ref 134–144)
Total Protein: 7.2 g/dL (ref 6.0–8.5)

## 2016-03-07 LAB — SPECIMEN STATUS

## 2016-03-07 LAB — LIPID PANEL
CHOLESTEROL TOTAL: 206 mg/dL — AB (ref 100–199)
Chol/HDL Ratio: 3 ratio units (ref 0.0–4.4)
HDL: 68 mg/dL (ref >39)
LDL CALC: 114 mg/dL — AB (ref 0–99)
TRIGLYCERIDES: 118 mg/dL (ref 0–149)
VLDL CHOLESTEROL CAL: 24 mg/dL (ref 5–40)

## 2016-03-07 LAB — THYROID PANEL WITH TSH
Free Thyroxine Index: 2 (ref 1.2–4.9)
T3 Uptake Ratio: 26 % (ref 24–39)
T4, Total: 7.7 ug/dL (ref 4.5–12.0)
TSH: 1.65 u[IU]/mL (ref 0.450–4.500)

## 2016-03-07 LAB — FOLLICLE STIMULATING HORMONE: FSH: 76.7 m[IU]/mL

## 2016-03-07 LAB — CYTOLOGY - PAP

## 2016-03-07 LAB — VITAMIN D 25 HYDROXY (VIT D DEFICIENCY, FRACTURES): Vit D, 25-Hydroxy: 19.7 ng/mL — ABNORMAL LOW (ref 30.0–100.0)

## 2016-03-11 ENCOUNTER — Other Ambulatory Visit: Payer: Self-pay | Admitting: Obstetrics and Gynecology

## 2016-03-11 DIAGNOSIS — E559 Vitamin D deficiency, unspecified: Secondary | ICD-10-CM

## 2016-03-11 DIAGNOSIS — Z780 Asymptomatic menopausal state: Secondary | ICD-10-CM | POA: Insufficient documentation

## 2016-03-11 MED ORDER — VITAMIN D (ERGOCALCIFEROL) 1.25 MG (50000 UNIT) PO CAPS: 50000.0000 [IU] | ORAL_CAPSULE | ORAL | 2 refills | Status: DC

## 2016-03-12 ENCOUNTER — Telehealth: Payer: Self-pay | Admitting: *Deleted

## 2016-03-12 NOTE — Telephone Encounter (Signed)
-----   Message from Purcell NailsMelody N Shambley, [place] sent at 03/11/2016  3:41 PM EDT ----- Please let her know she is definitely menopausal. Also cholesterol is slightly elevated and Vit D is really low. Please mail info on vit D if she has been given it before, and I am sending in 2 times weekly supplement. I want to recheck levels in 3 months (she can stop by for labs)

## 2016-03-12 NOTE — Telephone Encounter (Signed)
Notified pt and will mail vit d info

## 2016-03-17 ENCOUNTER — Telehealth: Payer: Self-pay | Admitting: *Deleted

## 2016-03-17 NOTE — Telephone Encounter (Signed)
-----   Message from Purcell NailsMelody N Shambley, [place] sent at 03/13/2016  4:59 PM EDT ----- Please send info on pap ASCUS, but neg HPV, let her know we need to repeat in 1 year, but no worries

## 2016-03-17 NOTE — Telephone Encounter (Signed)
Mailed pt info on pap smear

## 2016-04-01 ENCOUNTER — Other Ambulatory Visit: Payer: Self-pay | Admitting: Obstetrics and Gynecology

## 2016-04-01 ENCOUNTER — Ambulatory Visit
Admission: RE | Admit: 2016-04-01 | Discharge: 2016-04-01 | Disposition: A | Discharge status: Home / Self Care | Payer: BLUE CROSS/BLUE SHIELD | Source: Ambulatory Visit | Attending: Obstetrics and Gynecology | Admitting: Obstetrics and Gynecology

## 2016-04-01 DIAGNOSIS — Z1231 Encounter for screening mammogram for malignant neoplasm of breast: Secondary | ICD-10-CM | POA: Diagnosis present

## 2016-04-01 DIAGNOSIS — Z01419 Encounter for gynecological examination (general) (routine) without abnormal findings: Secondary | ICD-10-CM

## 2016-04-02 ENCOUNTER — Other Ambulatory Visit: Payer: Self-pay | Admitting: *Deleted

## 2016-04-02 ENCOUNTER — Telehealth: Payer: Self-pay | Admitting: Obstetrics and Gynecology

## 2016-04-02 MED ORDER — ESTRADIOL 0.1 MG/GM VA CREA: 2.0000 g | TOPICAL_CREAM | Freq: Every day | VAGINAL | 2 refills | Status: DC

## 2016-04-02 NOTE — Telephone Encounter (Signed)
Done-ac 

## 2016-04-02 NOTE — Telephone Encounter (Signed)
Her estrogen cream is still not at pharmacy. [facility] [address]

## 2016-04-28 ENCOUNTER — Telehealth: Payer: Self-pay | Admitting: Obstetrics and Gynecology

## 2016-04-28 NOTE — Telephone Encounter (Signed)
Patient called and asked if she could get 1 more refill on the phentermine. She states she is getting married and just needs 1 more month.Thanks

## 2016-04-29 ENCOUNTER — Other Ambulatory Visit: Payer: Self-pay | Admitting: Obstetrics and Gynecology

## 2016-04-29 MED ORDER — PHENTERMINE HCL 37.5 MG PO TABS: 37.5000 mg | ORAL_TABLET | Freq: Every day | ORAL | 0 refills | Status: DC

## 2016-04-29 NOTE — Telephone Encounter (Signed)
Done-ac 

## 2016-04-29 NOTE — Telephone Encounter (Signed)
pls advise

## 2016-04-29 NOTE — Telephone Encounter (Signed)
Done

## 2016-05-20 ENCOUNTER — Other Ambulatory Visit: Payer: Self-pay | Admitting: Physician Assistant

## 2016-05-20 DIAGNOSIS — G4700 Insomnia, unspecified: Secondary | ICD-10-CM

## 2016-05-20 NOTE — Telephone Encounter (Signed)
LOV 12/05/2015, last refill 03/06/2016. Allene DillonEmily Drozdowski, CMA

## 2016-06-24 ENCOUNTER — Encounter: Payer: BLUE CROSS/BLUE SHIELD | Admitting: Obstetrics and Gynecology

## 2016-07-08 ENCOUNTER — Ambulatory Visit (INDEPENDENT_AMBULATORY_CARE_PROVIDER_SITE_OTHER): Payer: BLUE CROSS/BLUE SHIELD | Admitting: Obstetrics and Gynecology

## 2016-07-08 ENCOUNTER — Encounter: Payer: Self-pay | Admitting: Obstetrics and Gynecology

## 2016-07-08 VITALS — BP 112/73 | HR 87 | Ht 65.0 in | Wt 143.2 lb

## 2016-07-08 DIAGNOSIS — Z79899 Other long term (current) drug therapy: Secondary | ICD-10-CM

## 2016-07-08 MED ORDER — PHENTERMINE HCL 37.5 MG PO TABS: 37.5000 mg | ORAL_TABLET | Freq: Every day | ORAL | 0 refills | Status: DC

## 2016-07-08 MED ORDER — CYANOCOBALAMIN 1000 MCG/ML IJ SOLN: 1000.0000 ug | Freq: Once | INTRAMUSCULAR | 0 refills | Status: AC

## 2016-07-08 MED ORDER — BUPROPION HCL ER (XL) 150 MG PO TB24: 150.0000 mg | ORAL_TABLET | Freq: Every day | ORAL | 3 refills | Status: DC

## 2016-07-08 NOTE — Progress Notes (Signed)
SUBJECTIVE:  51 y.o. here for follow-up weight loss visit, previously seen 4 weeks ago. Denies any concerns and feels like medication has worked well, and desires continuing for 1-2 more months, as she is a stress eater. Her father is sick and she has started eating a lot to cope. The medicine helps.  OBJECTIVE:  BP 112/73   Pulse 87   Ht 5\' 5"  (1.651 m)   Wt 143 lb 3.2 oz (65 kg)   BMI 23.83 kg/m   Body mass index is 23.83 kg/m. Patient appears well. ASSESSMENT:  Weight loss- responded well to weight loss plan PLAN:  To continue with current medications. B12 106900mcg/ml injection given RTC in 4 weeks as planned  Linda Ali, CNM

## 2016-08-22 ENCOUNTER — Other Ambulatory Visit: Payer: Self-pay | Admitting: Obstetrics and Gynecology

## 2016-08-22 ENCOUNTER — Telehealth: Payer: Self-pay | Admitting: Obstetrics and Gynecology

## 2016-08-22 DIAGNOSIS — G4700 Insomnia, unspecified: Secondary | ICD-10-CM

## 2016-08-22 MED ORDER — TRAZODONE HCL 150 MG PO TABS: 150.0000 mg | ORAL_TABLET | Freq: Every day | ORAL | 5 refills | Status: DC

## 2016-08-22 NOTE — Telephone Encounter (Signed)
pls advise

## 2016-08-22 NOTE — Telephone Encounter (Signed)
Patient called requesting a refill on trazodone. Thanks

## 2016-09-30 ENCOUNTER — Other Ambulatory Visit: Payer: Self-pay | Admitting: Obstetrics and Gynecology

## 2016-09-30 DIAGNOSIS — G4700 Insomnia, unspecified: Secondary | ICD-10-CM

## 2017-02-04 ENCOUNTER — Telehealth: Payer: Self-pay | Admitting: Obstetrics and Gynecology

## 2017-02-04 NOTE — Telephone Encounter (Signed)
Someone from [facility] [employer] left message on voicemail requesting to speak with Linda Ali about pt getting the same medication from 2 providers, using 2 pharmacies, & paying cash at one place and using insurance at another. [phone]. Please advise. Thanks TNP

## 2017-03-18 ENCOUNTER — Encounter: Payer: BLUE CROSS/BLUE SHIELD | Admitting: Obstetrics and Gynecology

## 2017-04-01 ENCOUNTER — Other Ambulatory Visit: Payer: Self-pay | Admitting: Obstetrics and Gynecology

## 2017-04-01 DIAGNOSIS — Z1231 Encounter for screening mammogram for malignant neoplasm of breast: Secondary | ICD-10-CM

## 2017-04-02 ENCOUNTER — Telehealth: Payer: Self-pay | Admitting: Obstetrics and Gynecology

## 2017-04-02 NOTE — Telephone Encounter (Signed)
Patient needs refill for Trazodone  [facility]

## 2017-04-03 ENCOUNTER — Other Ambulatory Visit: Payer: Self-pay | Admitting: *Deleted

## 2017-04-03 DIAGNOSIS — G4700 Insomnia, unspecified: Secondary | ICD-10-CM

## 2017-04-03 MED ORDER — TRAZODONE HCL 150 MG PO TABS: 150.0000 mg | ORAL_TABLET | Freq: Every day | ORAL | 5 refills | Status: DC

## 2017-04-03 NOTE — Telephone Encounter (Signed)
Done-ac 

## 2017-04-21 ENCOUNTER — Other Ambulatory Visit: Payer: Self-pay | Admitting: Obstetrics and Gynecology

## 2017-04-21 ENCOUNTER — Ambulatory Visit (INDEPENDENT_AMBULATORY_CARE_PROVIDER_SITE_OTHER): Payer: BLUE CROSS/BLUE SHIELD | Admitting: Obstetrics and Gynecology

## 2017-04-21 ENCOUNTER — Encounter: Payer: Self-pay | Admitting: Obstetrics and Gynecology

## 2017-04-21 VITALS — BP 119/83 | HR 89 | Ht 65.0 in | Wt 148.9 lb

## 2017-04-21 DIAGNOSIS — G4700 Insomnia, unspecified: Secondary | ICD-10-CM | POA: Diagnosis not present

## 2017-04-21 DIAGNOSIS — G44211 Episodic tension-type headache, intractable: Secondary | ICD-10-CM

## 2017-04-21 DIAGNOSIS — Z01419 Encounter for gynecological examination (general) (routine) without abnormal findings: Secondary | ICD-10-CM

## 2017-04-21 MED ORDER — ESTRADIOL 0.1 MG/GM VA CREA: 2.0000 g | TOPICAL_CREAM | Freq: Every day | VAGINAL | 2 refills | Status: DC

## 2017-04-21 MED ORDER — FLUOXETINE HCL 10 MG PO CAPS: 10.0000 mg | ORAL_CAPSULE | Freq: Every day | ORAL | 3 refills | Status: DC

## 2017-04-21 MED ORDER — CYANOCOBALAMIN 1000 MCG/ML IJ SOLN: 1000.0000 ug | INTRAMUSCULAR | 1 refills | Status: DC

## 2017-04-21 MED ORDER — TRAZODONE HCL 150 MG PO TABS: 150.0000 mg | ORAL_TABLET | Freq: Every day | ORAL | 5 refills | Status: DC

## 2017-04-21 MED ORDER — IBUPROFEN 800 MG PO TABS: 800.0000 mg | ORAL_TABLET | Freq: Three times a day (TID) | ORAL | 1 refills | Status: DC | PRN

## 2017-04-21 MED ORDER — PHENTERMINE HCL 37.5 MG PO TABS: 37.5000 mg | ORAL_TABLET | Freq: Every day | ORAL | 0 refills | Status: DC

## 2017-04-21 MED ORDER — SUMATRIPTAN SUCCINATE 50 MG PO TABS: 50.0000 mg | ORAL_TABLET | ORAL | 0 refills | Status: DC | PRN

## 2017-04-21 NOTE — Progress Notes (Signed)
Subjective:   Linda Ali is a 51 y.o. 622P0 Caucasian female here for a routine well-woman exam.  No LMP recorded. Patient is postmenopausal.    Current complaints: well butrin made her sick to stomach tried for 2 months, desires trying something else, also having daily headaches and request imitrex RX. Thinks it is all from stress at work. PCP: Rosezetta SchlatterBurnette       does desire labs  Social History: Sexual: heterosexual Marital Status: divorced Living situation: alone Occupation: admissions co-ordinator at [employer] Tobacco/alcohol: no tobacco use Illicit drugs: no history of illicit drug use  The following portions of the patient's history were reviewed and updated as appropriate: allergies, current medications, past family history, past medical history, past social history, past surgical history and problem list.  Past Medical History Past Medical History:  Diagnosis Date  . Anxiety   . Depression   . Dyspareunia in female   . Insomnia   . Vitamin D deficiency     Past Surgical History Past Surgical History:  Procedure Laterality Date  . CESAREAN SECTION  [id number]    Gynecologic History G2P0  No LMP recorded. Patient is postmenopausal. Contraception: post menopausal status Last Pap: 2017. Results were: ASCUS,HPV- Last mammogram: 201. Results were: normal   Obstetric History OB History  Gravida Para Term Preterm AB Living  2            SAB TAB Ectopic Multiple Live Births               # Outcome Date GA Lbr Len/2nd Weight Sex Delivery Anes PTL Lv  2 Gravida 1994    F CS-Unspec     1 Gravida 1992    M CS-Unspec         Current Medications Current Outpatient Medications on File Prior to Visit  Medication Sig Dispense Refill  . MULTIPLE VITAMIN PO Take 1 tablet by mouth daily.    . traZODone (DESYREL) 150 MG tablet Take 1 tablet (150 mg total) by mouth at bedtime. 30 tablet 5  . Vitamin D, Ergocalciferol, (DRISDOL) 50000 units CAPS capsule Take 1  capsule (50,000 Units total) by mouth 2 (two) times a week. 30 capsule 2  . buPROPion (WELLBUTRIN XL) 150 MG 24 hr tablet Take 1 tablet (150 mg total) by mouth daily. (Patient not taking: Reported on 04/21/2017) 30 tablet 3  . estradiol (ESTRACE VAGINAL) 0.1 MG/GM vaginal cream Place 2 g vaginally daily. (Patient not taking: Reported on 04/21/2017) 42.5 g 2  . phentermine (ADIPEX-P) 37.5 MG tablet Take 1 tablet (37.5 mg total) by mouth daily before breakfast. (Patient not taking: Reported on 04/21/2017) 30 tablet 0   No current facility-administered medications on file prior to visit.     Review of Systems Patient denies any headaches, blurred vision, shortness of breath, chest pain, abdominal pain, problems with bowel movements, urination, or intercourse.  Objective:  BP 119/83   Pulse 89   Ht 5\' 5"  (1.651 m)   Wt 148 lb 14.4 oz (67.5 kg)   BMI 24.78 kg/m  Physical Exam  General:  Well developed, well nourished, no acute distress. She is alert and oriented x3. Skin:  Warm and dry Neck:  Midline trachea, no thyromegaly or nodules Cardiovascular: Regular rate and rhythm, no murmur heard Lungs:  Effort normal, all lung fields clear to auscultation bilaterally Breasts:  No dominant palpable mass, retraction, or nipple discharge Abdomen:  Soft, non tender, no hepatosplenomegaly or masses Pelvic:  External genitalia is normal  in appearance.  The vagina is normal in appearance. The cervix is bulbous, no CMT.  Thin prep pap is done with HR HPV cotesting. Uterus is felt to be normal size, shape, and contour.  No adnexal masses or tenderness noted. Extremities:  No swelling or varicosities noted Psych:  She has a normal mood and affect  Assessment:   Healthy well-woman exam Depression   Plan:  Labs obtained- will follow up accordingly Will try prozac-rx sent in Also restart RX for Imitrex with motrin prn headaches as it worked in the past. F/U 1 year for Ae, or sooner if needed Mammogram  ordeed or sooner if problems   Camry Theiss Suzan NailerN Katiya Fike, CNM

## 2017-04-22 LAB — COMPREHENSIVE METABOLIC PANEL
A/G RATIO: 1.7 (ref 1.2–2.2)
ALBUMIN: 4.4 g/dL (ref 3.5–5.5)
ALK PHOS: 61 IU/L (ref 39–117)
ALT: 26 IU/L (ref 0–32)
AST: 18 IU/L (ref 0–40)
BUN / CREAT RATIO: 20 (ref 9–23)
BUN: 11 mg/dL (ref 6–24)
CHLORIDE: 101 mmol/L (ref 96–106)
CO2: 23 mmol/L (ref 20–29)
Calcium: 9.4 mg/dL (ref 8.7–10.2)
Creatinine, Ser: 0.54 mg/dL — ABNORMAL LOW (ref 0.57–1.00)
GFR calc non Af Amer: 109 mL/min/{1.73_m2} (ref >59)
GFR, EST AFRICAN AMERICAN: 125 mL/min/{1.73_m2} (ref >59)
Globulin, Total: 2.6 g/dL (ref 1.5–4.5)
Glucose: 94 mg/dL (ref 65–99)
POTASSIUM: 4.2 mmol/L (ref 3.5–5.2)
Sodium: 138 mmol/L (ref 134–144)
Total Protein: 7 g/dL (ref 6.0–8.5)

## 2017-04-22 LAB — VITAMIN D 25 HYDROXY (VIT D DEFICIENCY, FRACTURES): VIT D 25 HYDROXY: 29 ng/mL — AB (ref 30.0–100.0)

## 2017-04-22 LAB — LIPID PANEL
CHOL/HDL RATIO: 3 ratio (ref 0.0–4.4)
Cholesterol, Total: 197 mg/dL (ref 100–199)
HDL: 66 mg/dL (ref >39)
LDL Calculated: 117 mg/dL — ABNORMAL HIGH (ref 0–99)
TRIGLYCERIDES: 68 mg/dL (ref 0–149)
VLDL CHOLESTEROL CAL: 14 mg/dL (ref 5–40)

## 2017-04-22 LAB — TSH: TSH: 1.39 u[IU]/mL (ref 0.450–4.500)

## 2017-04-23 LAB — CYTOLOGY - PAP

## 2017-04-24 ENCOUNTER — Telehealth: Payer: Self-pay | Admitting: *Deleted

## 2017-04-24 NOTE — Telephone Encounter (Signed)
Mailed all info to pt 

## 2017-04-24 NOTE — Telephone Encounter (Signed)
-----   Message from Purcell NailsMelody N Shambley, [place] sent at 04/22/2017 10:33 AM EST ----- Please let her know lab results, Vit d is still a little low, so I want her to continue vit D supplements.

## 2017-04-28 ENCOUNTER — Ambulatory Visit
Admission: RE | Admit: 2017-04-28 | Discharge: 2017-04-28 | Disposition: A | Discharge status: Home / Self Care | Payer: BLUE CROSS/BLUE SHIELD | Source: Ambulatory Visit | Attending: Obstetrics and Gynecology | Admitting: Obstetrics and Gynecology

## 2017-04-28 DIAGNOSIS — Z1231 Encounter for screening mammogram for malignant neoplasm of breast: Secondary | ICD-10-CM | POA: Insufficient documentation

## 2017-10-15 ENCOUNTER — Telehealth: Payer: Self-pay | Admitting: Obstetrics and Gynecology

## 2017-10-15 NOTE — Telephone Encounter (Signed)
The patient called and stated that she would like for her nurse Amy to give her a call in regards to her prescription needing a refill. Please advise.

## 2017-10-20 NOTE — Telephone Encounter (Signed)
Pt needs to make appt for refill

## 2017-10-27 ENCOUNTER — Ambulatory Visit (INDEPENDENT_AMBULATORY_CARE_PROVIDER_SITE_OTHER): Payer: BLUE CROSS/BLUE SHIELD | Admitting: Obstetrics and Gynecology

## 2017-10-27 ENCOUNTER — Encounter: Payer: Self-pay | Admitting: Obstetrics and Gynecology

## 2017-10-27 VITALS — BP 101/71 | HR 85 | Ht 65.0 in | Wt 130.6 lb

## 2017-10-27 DIAGNOSIS — Z79899 Other long term (current) drug therapy: Secondary | ICD-10-CM | POA: Diagnosis not present

## 2017-10-27 DIAGNOSIS — F330 Major depressive disorder, recurrent, mild: Secondary | ICD-10-CM

## 2017-10-27 DIAGNOSIS — G4700 Insomnia, unspecified: Secondary | ICD-10-CM

## 2017-10-27 MED ORDER — FLUOXETINE HCL 10 MG PO CAPS: 10.0000 mg | ORAL_CAPSULE | Freq: Every day | ORAL | 3 refills | Status: DC

## 2017-10-27 MED ORDER — VITAMIN D (ERGOCALCIFEROL) 1.25 MG (50000 UNIT) PO CAPS: 50000.0000 [IU] | ORAL_CAPSULE | ORAL | 2 refills | Status: DC

## 2017-10-27 NOTE — Progress Notes (Signed)
  Subjective:     Patient ID: Linda Ali, female   DOB: 06-14-1966, 52 y.o.   MRN: [id number]  HPI  Needs refill on trazadone. Stopped wellbutrin first of January. Didn't like the way it made feel like she wasn't herself and hurt her stomach. Tired zoloft after divorce. But doesn't want to gain weight.  Doing weight watchers to maintain weight loss Dad has pakinson's and she is very stress in caring for him, works Teacher, English as a foreign language at [employer].  Depression screen [facility] 2/9 10/27/2017 04/21/2017  Decreased Interest 1 1  Down, Depressed, Hopeless 1 1  PHQ - 2 Score 2 2  Altered sleeping 3 2  Tired, decreased energy 2 2  Change in appetite 0 0  Feeling bad or failure about yourself  0 1  Trouble concentrating 1 0  Moving slowly or fidgety/restless 1 0  Suicidal thoughts 0 0  PHQ-9 Score 9 7  Difficult doing work/chores Somewhat difficult Not difficult at all   Review of Systems Negative execpt stated above in HPI    Objective:   Physical Exam A&O x4 Well groomed female in no distress Blood pressure 101/71, pulse 85, height  (1.651 m), weight 130 lb 9.6 oz (59.2 kg).  Body mass index is 21.73 kg/m.  PE no indicated    Assessment:     Depression  Sleep disturbance  fatigue    Plan:     Will try prozac again  at bedtime. Will call in 4-6 weeks to let me know how it is working. Refilled trazadone and B12 injection given. RTC as needed.

## 2017-10-28 DIAGNOSIS — E559 Vitamin D deficiency, unspecified: Secondary | ICD-10-CM | POA: Diagnosis not present

## 2017-10-28 DIAGNOSIS — G4700 Insomnia, unspecified: Secondary | ICD-10-CM | POA: Diagnosis not present

## 2017-12-25 DIAGNOSIS — F5101 Primary insomnia: Secondary | ICD-10-CM | POA: Diagnosis not present

## 2017-12-25 DIAGNOSIS — G43009 Migraine without aura, not intractable, without status migrainosus: Secondary | ICD-10-CM | POA: Diagnosis not present

## 2017-12-30 ENCOUNTER — Other Ambulatory Visit: Payer: Self-pay

## 2017-12-30 DIAGNOSIS — Z1211 Encounter for screening for malignant neoplasm of colon: Secondary | ICD-10-CM

## 2018-01-04 ENCOUNTER — Other Ambulatory Visit: Payer: Self-pay

## 2018-02-12 ENCOUNTER — Encounter: Admission: RE | Payer: Self-pay | Source: Ambulatory Visit

## 2018-02-12 ENCOUNTER — Ambulatory Visit
Admission: RE | Admit: 2018-02-12 | Payer: BLUE CROSS/BLUE SHIELD | Source: Ambulatory Visit | Admitting: Gastroenterology

## 2018-02-12 SURGERY — COLONOSCOPY WITH PROPOFOL
Anesthesia: General

## 2018-03-24 ENCOUNTER — Other Ambulatory Visit: Payer: Self-pay | Admitting: Obstetrics and Gynecology

## 2018-03-24 DIAGNOSIS — Z1231 Encounter for screening mammogram for malignant neoplasm of breast: Secondary | ICD-10-CM

## 2018-03-29 DIAGNOSIS — L700 Acne vulgaris: Secondary | ICD-10-CM | POA: Diagnosis not present

## 2018-04-22 ENCOUNTER — Encounter: Payer: BLUE CROSS/BLUE SHIELD | Admitting: Obstetrics and Gynecology

## 2018-04-28 ENCOUNTER — Encounter: Payer: BLUE CROSS/BLUE SHIELD | Admitting: Obstetrics and Gynecology

## 2018-04-29 ENCOUNTER — Other Ambulatory Visit: Payer: Self-pay | Admitting: Obstetrics and Gynecology

## 2018-04-29 ENCOUNTER — Ambulatory Visit
Admission: RE | Admit: 2018-04-29 | Discharge: 2018-04-29 | Disposition: A | Discharge status: Home / Self Care | Payer: BLUE CROSS/BLUE SHIELD | Source: Ambulatory Visit | Attending: Obstetrics and Gynecology | Admitting: Obstetrics and Gynecology

## 2018-04-29 ENCOUNTER — Encounter: Payer: Self-pay | Admitting: Obstetrics and Gynecology

## 2018-04-29 ENCOUNTER — Ambulatory Visit (INDEPENDENT_AMBULATORY_CARE_PROVIDER_SITE_OTHER): Payer: BLUE CROSS/BLUE SHIELD | Admitting: Obstetrics and Gynecology

## 2018-04-29 VITALS — BP 106/77 | HR 79 | Ht 65.0 in | Wt 124.7 lb

## 2018-04-29 DIAGNOSIS — G4700 Insomnia, unspecified: Secondary | ICD-10-CM | POA: Diagnosis not present

## 2018-04-29 DIAGNOSIS — Z01419 Encounter for gynecological examination (general) (routine) without abnormal findings: Secondary | ICD-10-CM | POA: Diagnosis not present

## 2018-04-29 DIAGNOSIS — Z13220 Encounter for screening for lipoid disorders: Secondary | ICD-10-CM | POA: Diagnosis not present

## 2018-04-29 DIAGNOSIS — Z1231 Encounter for screening mammogram for malignant neoplasm of breast: Secondary | ICD-10-CM | POA: Diagnosis not present

## 2018-04-29 MED ORDER — VITAMIN D (ERGOCALCIFEROL) 1.25 MG (50000 UNIT) PO CAPS: 50000.0000 [IU] | ORAL_CAPSULE | ORAL | 2 refills | Status: DC

## 2018-04-29 MED ORDER — SUMATRIPTAN SUCCINATE 50 MG PO TABS: 50.0000 mg | ORAL_TABLET | ORAL | 0 refills | Status: DC | PRN

## 2018-04-29 MED ORDER — ESTRADIOL 0.1 MG/GM VA CREA: 2.0000 g | TOPICAL_CREAM | Freq: Every day | VAGINAL | 2 refills | Status: DC

## 2018-04-29 MED ORDER — ESCITALOPRAM OXALATE 10 MG PO TABS: 10.0000 mg | ORAL_TABLET | Freq: Every day | ORAL | 6 refills | Status: DC

## 2018-04-29 MED ORDER — TRAZODONE HCL 150 MG PO TABS: 150.0000 mg | ORAL_TABLET | Freq: Every day | ORAL | 5 refills | Status: DC

## 2018-04-29 NOTE — Progress Notes (Signed)
Subjective:   Linda Ali is a 52 y.o. 402P0 Caucasian female here for a routine well-woman exam.  No LMP recorded. Patient is postmenopausal.    Current complaints: doesn't like the way prozac makes her feel out of it. Wellbutrin makes her nauseated. 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 Tobacco/alcohol: no tobacco use Illicit drugs: no history of illicit drug use  The following portions of the patient's history were reviewed and updated as appropriate: allergies, current medications, past family history, past medical history, past social history, past surgical history and problem list.  Past Medical History Past Medical History:  Diagnosis Date  . Anxiety   . Depression   . Dyspareunia in female   . Insomnia   . Vitamin D deficiency     Past Surgical History Past Surgical History:  Procedure Laterality Date  . CESAREAN SECTION  [id number]    Gynecologic History G2P0  No LMP recorded. Patient is postmenopausal. Contraception: post menopausal status Last Pap: 04/2017. Results were: normal Last mammogram: today. Results were: normal   Obstetric History OB History  Gravida Para Term Preterm AB Living  2            SAB TAB Ectopic Multiple Live Births               # Outcome Date GA Lbr Len/2nd Weight Sex Delivery Anes PTL Lv  2 Gravida 1994    F CS-Unspec     1 Gravida 1992    M CS-Unspec       Current Medications Current Outpatient Medications on File Prior to Visit  Medication Sig Dispense Refill  . cyanocobalamin (,VITAMIN B-12,) 1000 MCG/ML injection Inject 1 mL (1,000 mcg total) every 30 (thirty) days into the muscle. 10 mL 1  . estradiol (ESTRACE VAGINAL) 0.1 MG/GM vaginal cream Place 2 g daily vaginally. 42.5 g 2  . MULTIPLE VITAMIN PO Take 1 tablet by mouth daily.    . SUMAtriptan (IMITREX) 50 MG tablet Take 1 tablet (50  mg total) every 2 (two) hours as needed by mouth for migraine. May repeat in 2 hours if headache persists or recurs. 10 tablet 0  . traZODone (DESYREL) 150 MG tablet Take 1 tablet (150 mg total) at bedtime by mouth. 30 tablet 5  . Vitamin D, Ergocalciferol, (DRISDOL) 50000 units CAPS capsule Take 1 capsule (50,000 Units total) by mouth 2 (two) times a week. 30 capsule 2  . FLUoxetine (PROZAC) 10 MG capsule Take 1 capsule (10 mg total) by mouth daily. (Patient not taking: Reported on 04/29/2018) 30 capsule 3  . ibuprofen (ADVIL,MOTRIN) 800 MG tablet Take 1 tablet (800 mg total) every 8 (eight) hours as needed by mouth. (Patient not taking: Reported on 10/27/2017) 60 tablet 1   No current facility-administered medications on file prior to visit.     Review of Systems Patient denies any headaches, blurred vision, shortness of breath, chest pain, abdominal pain, problems with bowel movements, urination, or intercourse.  Objective:  BP 106/77   Pulse 79   Ht 5\' 5"  (1.651 m)   Wt 124 lb 11.2 oz (56.6 kg)   BMI 20.75 kg/m  Physical Exam  General:  Well developed, well nourished, no acute distress. She is alert and oriented x3. Skin:  Warm and dry Neck:  Midline trachea, no thyromegaly or nodules Cardiovascular: Regular rate and rhythm, no murmur heard Lungs:  Effort normal, all lung fields clear to auscultation bilaterally Breasts:  No dominant palpable mass, retraction, or nipple discharge Abdomen:  Soft, non tender, no hepatosplenomegaly or masses Pelvic:  External genitalia is normal in appearance.  The vagina is normal in appearance. The cervix is bulbous, no CMT.  Thin prep pap is not done. Uterus is felt to be normal size, shape, and contour.  No adnexal masses or tenderness noted. Extremities:  No swelling or varicosities noted Psych:  She has a normal mood and affect  Assessment:   Healthy well-woman exam anxiety  Plan:  Labs obtained- will follow up accordingly Will try lexapro  at 10 mg daily, she will let me know via Mychart how she is feeling in 3-4 weeks or as needed. F/U 1 year for AE, or sooner if needed    Suzan Nailer, CNM

## 2018-04-30 ENCOUNTER — Telehealth: Payer: Self-pay | Admitting: *Deleted

## 2018-04-30 LAB — COMPREHENSIVE METABOLIC PANEL
ALBUMIN: 4.6 g/dL (ref 3.5–5.5)
ALK PHOS: 64 IU/L (ref 39–117)
ALT: 47 IU/L — ABNORMAL HIGH (ref 0–32)
AST: 24 IU/L (ref 0–40)
Albumin/Globulin Ratio: 1.9 (ref 1.2–2.2)
BUN / CREAT RATIO: 19 (ref 9–23)
BUN: 13 mg/dL (ref 6–24)
Bilirubin Total: 0.3 mg/dL (ref 0.0–1.2)
CO2: 22 mmol/L (ref 20–29)
CREATININE: 0.7 mg/dL (ref 0.57–1.00)
Calcium: 9.4 mg/dL (ref 8.7–10.2)
Chloride: 99 mmol/L (ref 96–106)
GFR calc non Af Amer: 99 mL/min/{1.73_m2} (ref >59)
GFR, EST AFRICAN AMERICAN: 114 mL/min/{1.73_m2} (ref >59)
Globulin, Total: 2.4 g/dL (ref 1.5–4.5)
Glucose: 78 mg/dL (ref 65–99)
Potassium: 4.2 mmol/L (ref 3.5–5.2)
SODIUM: 138 mmol/L (ref 134–144)
TOTAL PROTEIN: 7 g/dL (ref 6.0–8.5)

## 2018-04-30 LAB — LIPID PANEL
CHOLESTEROL TOTAL: 220 mg/dL — AB (ref 100–199)
Chol/HDL Ratio: 3.1 ratio (ref 0.0–4.4)
HDL: 72 mg/dL (ref >39)
LDL CALC: 137 mg/dL — AB (ref 0–99)
Triglycerides: 53 mg/dL (ref 0–149)
VLDL Cholesterol Cal: 11 mg/dL (ref 5–40)

## 2018-04-30 LAB — HEMOGLOBIN A1C
Est. average glucose Bld gHb Est-mCnc: 114 mg/dL
Hgb A1c MFr Bld: 5.6 % (ref 4.8–5.6)

## 2018-04-30 LAB — TSH: TSH: 1.56 u[IU]/mL (ref 0.450–4.500)

## 2018-04-30 NOTE — Telephone Encounter (Signed)
Mailed pt all info 

## 2018-04-30 NOTE — Telephone Encounter (Signed)
-----   Message from Purcell NailsMelody N Shambley, [place] sent at 04/30/2018 10:59 AM EST ----- Please let her know lab results. Mail info on foods high in cholesterol and tell her I want her to work on reducing high cholesterol foods daily.  This may be hereditarty also, and next year I will do a more detailed test. But we will watch for now.

## 2018-05-08 ENCOUNTER — Other Ambulatory Visit: Payer: Self-pay | Admitting: Obstetrics and Gynecology

## 2018-05-10 ENCOUNTER — Other Ambulatory Visit: Payer: Self-pay | Admitting: *Deleted

## 2018-05-10 MED ORDER — CYANOCOBALAMIN 1000 MCG/ML IJ SOLN: 1000.0000 ug | INTRAMUSCULAR | 1 refills | Status: DC

## 2018-06-17 DIAGNOSIS — Z1211 Encounter for screening for malignant neoplasm of colon: Secondary | ICD-10-CM | POA: Diagnosis not present

## 2018-06-17 DIAGNOSIS — G4700 Insomnia, unspecified: Secondary | ICD-10-CM | POA: Diagnosis not present

## 2018-07-09 DIAGNOSIS — Z0000 Encounter for general adult medical examination without abnormal findings: Secondary | ICD-10-CM | POA: Diagnosis not present

## 2018-07-09 DIAGNOSIS — Z6821 Body mass index (BMI) 21.0-21.9, adult: Secondary | ICD-10-CM | POA: Diagnosis not present

## 2018-10-05 ENCOUNTER — Other Ambulatory Visit: Payer: Self-pay | Admitting: Obstetrics and Gynecology

## 2018-11-15 ENCOUNTER — Other Ambulatory Visit: Payer: Self-pay | Admitting: Obstetrics and Gynecology

## 2018-11-15 DIAGNOSIS — G4700 Insomnia, unspecified: Secondary | ICD-10-CM

## 2018-11-29 ENCOUNTER — Other Ambulatory Visit: Payer: Self-pay

## 2018-11-29 ENCOUNTER — Ambulatory Visit (INDEPENDENT_AMBULATORY_CARE_PROVIDER_SITE_OTHER): Payer: BC Managed Care – PPO

## 2018-11-29 ENCOUNTER — Ambulatory Visit
Admission: EM | Admit: 2018-11-29 | Discharge: 2018-11-29 | Disposition: A | Discharge status: Home / Self Care | Payer: BC Managed Care – PPO | Attending: Emergency Medicine | Admitting: Emergency Medicine

## 2018-11-29 DIAGNOSIS — S6991XA Unspecified injury of right wrist, hand and finger(s), initial encounter: Secondary | ICD-10-CM

## 2018-11-29 DIAGNOSIS — W228XXA Striking against or struck by other objects, initial encounter: Secondary | ICD-10-CM

## 2018-11-29 DIAGNOSIS — W109XXA Fall (on) (from) unspecified stairs and steps, initial encounter: Secondary | ICD-10-CM

## 2018-11-29 DIAGNOSIS — M79641 Pain in right hand: Secondary | ICD-10-CM | POA: Diagnosis not present

## 2018-11-29 NOTE — ED Triage Notes (Signed)
Patient complains of right thumb that started last night after falling down the steps while carrying clothes. Patient states that she has been unable to move the thumb. Patient states that her thumb has been throbbing through the night.

## 2018-11-29 NOTE — ED Provider Notes (Signed)
[facility]    CSN: [id number] Arrival date & time: 11/29/18  1012     History   Chief Complaint Chief Complaint  Patient presents with  . Fall    HPI Linda Ali is a 53 y.o. female who presents with pain to right thumb s/p fall yesterday eveining. Pt was moving laundry down stairs and missed the last three steps. Pt hit right hand onto a table and now having pain throughout thumb. Unable to flex/extend thumb without pain. Slight relief with tylenol and ibuprofen. Per report, pt did not hit any other body part during fall; no LOC.    Past Medical History:  Diagnosis Date  . Anxiety   . Depression   . Dyspareunia in female   . Insomnia   . Vitamin D deficiency     Patient Active Problem List   Diagnosis Date Noted  . Vitamin D deficiency 03/11/2016  . Menopause 03/11/2016  . Anxiety 12/06/2014  . Cannot sleep 12/06/2014    Past Surgical History:  Procedure Laterality Date  . CESAREAN SECTION  [id number]    OB History    Gravida  2   Para      Term      Preterm      AB      Living        SAB      TAB      Ectopic      Multiple      Live Births               Home Medications    Prior to Admission medications   Medication Sig Start Date End Date Taking? Authorizing Provider  cyanocobalamin (,VITAMIN B-12,) 1000 MCG/ML injection INJECT 1 ML (1,000 MCG TOTAL) EVERY 30 (THIRTY) DAYS INTO THE MUSCLE. 05/17/18  Yes Shambley, Melody N, CNM  cyanocobalamin (,VITAMIN B-12,) 1000 MCG/ML injection Inject 1 mL (1,000 mcg total) into the muscle every 30 (thirty) days. 05/10/18  Yes Shambley, Melody N, CNM  escitalopram (LEXAPRO) 10 MG tablet Take 1 tablet (10 mg total) by mouth daily. 04/29/18  Yes Shambley, Melody N, CNM  estradiol (ESTRACE VAGINAL) 0.1 MG/GM vaginal cream Place 2 g vaginally daily. 04/29/18  Yes Shambley, Melody N, CNM  FLUoxetine (PROZAC) 10 MG capsule Take 1 capsule (10 mg total) by mouth daily. 10/27/17  Yes  Shambley, Melody N, CNM  ibuprofen (ADVIL,MOTRIN) 800 MG tablet Take 1 tablet (800 mg total) every 8 (eight) hours as needed by mouth. 04/21/17  Yes Shambley, Melody N, CNM  MULTIPLE VITAMIN PO Take 1 tablet by mouth daily.   Yes [provider]  SUMAtriptan (IMITREX) 50 MG tablet TAKE 1 TABLET BY MOUTH EVERY 2 HRS AS NEEDED FOR MIGRAINE. REPEAT IN 2 HRS IF HEADACHE PERSISTS OR RECURS 10/05/18  Yes Lawhorn, Vanessa DurhamJenkins Michelle, CNM  traZODone (DESYREL) 150 MG tablet TAKE 1 TABLET BY MOUTH AT BEDTIME 11/15/18  Yes Shambley, Melody N, CNM  Vitamin D, Ergocalciferol, (DRISDOL) 1.25 MG (50000 UT) CAPS capsule Take 1 capsule (50,000 Units total) by mouth 2 (two) times a week. 04/29/18  Yes Shambley, Melody N, CNM  Vitamin D, Ergocalciferol, (DRISDOL) 1.25 MG (50000 UT) CAPS capsule TAKE 1 CAPSULE (50,000 UNITS TOTAL) BY MOUTH 2 (TWO) TIMES A WEEK. 04/29/18  Yes Shambley, Melody N, CNM    Family History Family History  Problem Relation Age of Onset  . Parkinson's disease Father   . Lymphoma Father   . Breast cancer Neg Hx  Social History Social History   Tobacco Use  . Smoking status: Never Smoker  . Smokeless tobacco: Never Used  Substance Use Topics  . Alcohol use: Yes    Alcohol/week: 0.0 standard drinks    Comment: OCCASIONALLY  . Drug use: No     Allergies   Patient has no known allergies.   Review of Systems Review of Systems  Musculoskeletal: Positive for arthralgias. Negative for joint swelling.  All other systems reviewed and are negative.    Physical Exam Triage Vital Signs ED Triage Vitals  Enc Vitals Group     BP 11/29/18 1032 116/79     Pulse Rate 11/29/18 1032 77     Resp 11/29/18 1032 18     Temp 11/29/18 1032 98.3 F (36.8 C)     Temp Source 11/29/18 1032 Oral     SpO2 11/29/18 1032 100 %     Weight 11/29/18 1029 125 lb (56.7 kg)     Height 11/29/18 1029 5\' 5"  (1.651 m)     Head Circumference --      Peak Flow --      Pain Score 11/29/18 1028 8      Pain Loc --      Pain Edu? --      Excl. in GC? --    No data found.  Updated Vital Signs BP 116/79 (BP Location: Left Arm)   Pulse 77   Temp 98.3 F (36.8 C) (Oral)   Resp 18   Ht 5\' 5"  (1.651 m)   Wt 125 lb (56.7 kg)   SpO2 100%   BMI 20.80 kg/m   Physical Exam Vitals signs reviewed.  Musculoskeletal:     Right hand: She exhibits tenderness and bony tenderness. Decreased strength noted. She exhibits thumb/finger opposition.       Hands:  Neurological:     Mental Status: She is alert.  Psychiatric:        Behavior: Behavior normal.        Judgment: Judgment normal.      UC Treatments / Results  Labs (all labs ordered are listed, but only abnormal results are displayed) Labs Reviewed - No data to display  EKG None  Radiology Dg Hand Complete Right  Result Date: 11/29/2018 CLINICAL DATA:  Pt states she tripped last pm injuring right hand. Most pain 1st MCP joint area EXAM: RIGHT HAND - COMPLETE 3+ VIEW COMPARISON:  None. FINDINGS: There is no evidence of fracture or dislocation. There is mild osteoarthritis of the first CMC joint. Soft tissues are unremarkable. IMPRESSION: No acute osseous injury of the right hand. Electronically Signed   By: Elige KoHetal  Patel   On: 11/29/2018 10:57    Procedures Procedures (including critical care time)  Medications Ordered in UC Medications - No data to display  Initial Impression / Assessment and Plan / UC Course  I have reviewed the triage vital signs and the nursing notes.  Pertinent labs & imaging results that were available during my care of the patient were reviewed by me and considered in my medical decision making (see chart for details).  Pt presents with pain to right thumb post fall. Xray negative for fractures. Pt treated for thumb injury.sprain. Pt instructed to take previously prescribed 800mg  ibuprofen TID and ice area as needed. Thumb spica splint given in office. Splint to be worn at work as needed. Pt to  follow up with PCP if pain is unchanging/worsening. All questions answered and all concerns addressed.  Final Clinical Impressions(s) / UC Diagnoses   Final diagnoses:  Injury of right thumb, initial encounter     Discharge Instructions     Take prescribed 800mg  ibuprofen three times a day as needed. Ice to thumb as needed.     ED Prescriptions    None       Gertie Baron, NP 11/29/18 1243

## 2018-11-29 NOTE — Discharge Instructions (Addendum)
Take prescribed 800mg  ibuprofen three times a day as needed. Ice to thumb as needed.

## 2018-12-29 DIAGNOSIS — Z03818 Encounter for observation for suspected exposure to other biological agents ruled out: Secondary | ICD-10-CM | POA: Diagnosis not present

## 2019-03-28 ENCOUNTER — Other Ambulatory Visit: Payer: Self-pay | Admitting: Obstetrics and Gynecology

## 2019-03-28 DIAGNOSIS — Z1231 Encounter for screening mammogram for malignant neoplasm of breast: Secondary | ICD-10-CM

## 2019-04-26 ENCOUNTER — Other Ambulatory Visit: Payer: Self-pay

## 2019-04-26 ENCOUNTER — Telehealth: Payer: Self-pay | Admitting: Certified Nurse Midwife

## 2019-04-26 DIAGNOSIS — G4700 Insomnia, unspecified: Secondary | ICD-10-CM

## 2019-04-26 MED ORDER — TRAZODONE HCL 150 MG PO TABS: 150.0000 mg | ORAL_TABLET | Freq: Every day | ORAL | 0 refills | Status: DC

## 2019-04-26 NOTE — Telephone Encounter (Signed)
Refill sent to preferred pharmacy. Attempted to contact patient- no answer no voicemail.

## 2019-04-26 NOTE — Telephone Encounter (Signed)
The patient rescheduled/transfered care to [facility] and is wanting to know if she is able to have her medication traZODone (DESYREL) 150 MG tablet [8084] refilled. Please advise.

## 2019-05-04 ENCOUNTER — Encounter: Payer: BLUE CROSS/BLUE SHIELD | Admitting: Obstetrics and Gynecology

## 2019-05-05 ENCOUNTER — Ambulatory Visit
Admission: RE | Admit: 2019-05-05 | Discharge: 2019-05-05 | Disposition: A | Discharge status: Home / Self Care | Payer: BC Managed Care – PPO | Source: Ambulatory Visit | Attending: Obstetrics and Gynecology | Admitting: Obstetrics and Gynecology

## 2019-05-05 DIAGNOSIS — Z1231 Encounter for screening mammogram for malignant neoplasm of breast: Secondary | ICD-10-CM | POA: Diagnosis not present

## 2019-05-16 ENCOUNTER — Ambulatory Visit (INDEPENDENT_AMBULATORY_CARE_PROVIDER_SITE_OTHER): Payer: BC Managed Care – PPO | Admitting: Certified Nurse Midwife

## 2019-05-16 ENCOUNTER — Other Ambulatory Visit: Payer: Self-pay

## 2019-05-16 ENCOUNTER — Encounter: Payer: Self-pay | Admitting: Certified Nurse Midwife

## 2019-05-16 VITALS — BP 102/71 | HR 81 | Ht 65.0 in | Wt 130.5 lb

## 2019-05-16 DIAGNOSIS — Z01419 Encounter for gynecological examination (general) (routine) without abnormal findings: Secondary | ICD-10-CM

## 2019-05-16 DIAGNOSIS — Z1211 Encounter for screening for malignant neoplasm of colon: Secondary | ICD-10-CM

## 2019-05-16 MED ORDER — CYANOCOBALAMIN 1000 MCG/ML IJ SOLN: 1000.0000 ug | INTRAMUSCULAR | 1 refills | Status: DC

## 2019-05-16 MED ORDER — SERTRALINE HCL 25 MG PO TABS: 25.0000 mg | ORAL_TABLET | Freq: Every day | ORAL | 2 refills | Status: DC

## 2019-05-16 MED ORDER — PHENTERMINE HCL 37.5 MG PO TABS: 37.5000 mg | ORAL_TABLET | Freq: Every day | ORAL | 0 refills | Status: DC

## 2019-05-16 NOTE — Patient Instructions (Signed)

## 2019-05-16 NOTE — Progress Notes (Addendum)
GYNECOLOGY ANNUAL PREVENTATIVE CARE ENCOUNTER NOTE  History:     Linda Ali is a 53 y.o. G2P0 female here for a routine annual gynecologic exam.  Current complaints: weight gain. Would like to start weight loss program.  She has done in the past and been successful.  .   Denies abnormal vaginal bleeding, discharge, pelvic pain, problems with intercourse or other gynecologic concerns.      Works nursing home  Divorced , in a relationship (sexually active) Exercise 2 x wkly.   Gynecologic History No LMP recorded. Patient is postmenopausal. Contraception: none Last Pap: 04/2017. Results were: normal with negative HPV Last mammogram: 05/05/19 Results were: normal  Obstetric History OB History  Gravida Para Term Preterm AB Living  2            SAB TAB Ectopic Multiple Live Births               # Outcome Date GA Lbr Len/2nd Weight Sex Delivery Anes PTL Lv  2 Gravida 1994    F CS-Unspec     1 Gravida 1992    M CS-Unspec       Past Medical History:  Diagnosis Date  . Anxiety   . Depression   . Dyspareunia in female   . Insomnia   . Vitamin D deficiency     Past Surgical History:  Procedure Laterality Date  . CESAREAN SECTION  [id number]    Current Outpatient Medications on File Prior to Visit  Medication Sig Dispense Refill  . cyanocobalamin (,VITAMIN B-12,) 1000 MCG/ML injection INJECT 1 ML (1,000 MCG TOTAL) EVERY 30 (THIRTY) DAYS INTO THE MUSCLE. 10 mL 4  . estradiol (ESTRACE VAGINAL) 0.1 MG/GM vaginal cream Place 2 g vaginally daily. 42.5 g 2  . ibuprofen (ADVIL,MOTRIN) 800 MG tablet Take 1 tablet (800 mg total) every 8 (eight) hours as needed by mouth. 60 tablet 1  . MULTIPLE VITAMIN PO Take 1 tablet by mouth daily.    . SUMAtriptan (IMITREX) 50 MG tablet TAKE 1 TABLET BY MOUTH EVERY 2 HRS AS NEEDED FOR MIGRAINE. REPEAT IN 2 HRS IF HEADACHE PERSISTS OR RECURS 9 tablet 1  . traZODone (DESYREL) 150 MG tablet Take 1 tablet (150 mg total) by mouth at bedtime. 30  tablet 0  . Vitamin D, Ergocalciferol, (DRISDOL) 1.25 MG (50000 UT) CAPS capsule Take 1 capsule (50,000 Units total) by mouth 2 (two) times a week. 30 capsule 2  . cyanocobalamin (,VITAMIN B-12,) 1000 MCG/ML injection Inject 1 mL (1,000 mcg total) into the muscle every 30 (thirty) days. (Patient not taking: Reported on 05/16/2019) 10 mL 1  . escitalopram (LEXAPRO) 10 MG tablet Take 1 tablet (10 mg total) by mouth daily. (Patient not taking: Reported on 05/16/2019) 30 tablet 6  . FLUoxetine (PROZAC) 10 MG capsule Take 1 capsule (10 mg total) by mouth daily. (Patient not taking: Reported on 05/16/2019) 30 capsule 3  . phentermine (ADIPEX-P) 37.5 MG tablet Take 37.5 mg by mouth daily.    . Vitamin D, Ergocalciferol, (DRISDOL) 1.25 MG (50000 UT) CAPS capsule TAKE 1 CAPSULE (50,000 UNITS TOTAL) BY MOUTH 2 (TWO) TIMES A WEEK. (Patient not taking: Reported on 05/16/2019) 8 capsule 11   No current facility-administered medications on file prior to visit.     No Known Allergies  Social History:  reports that she has never smoked. She has never used smokeless tobacco. She reports current alcohol use. She reports that she does not use drugs.  Family History  Problem  Relation Age of Onset  . Parkinson's disease Father   . Lymphoma Father   . Breast cancer Neg Hx     The following portions of the patient's history were reviewed and updated as appropriate: allergies, current medications, past family history, past medical history, past social history, past surgical history and problem list.  No smoking or drugs, occasional alcohol.  Review of Systems Pertinent items noted in HPI and remainder of comprehensive ROS otherwise negative.  Physical Exam:  BP 102/71   Pulse 81   Ht 5\' 5"  (1.651 m)   Wt 130 lb 8 oz (59.2 kg)   BMI 21.72 kg/m  CONSTITUTIONAL: Well-developed, well-nourished female in no acute distress.  HENT:  Normocephalic, atraumatic, External right and left ear normal. Oropharynx is  clear and moist EYES: Conjunctivae and EOM are normal. Pupils are equal, round, and reactive to light. No scleral icterus.  NECK: Normal range of motion, supple, no masses.  Normal thyroid.  SKIN: Skin is warm and dry. No rash noted. Not diaphoretic. No erythema. No pallor. MUSCULOSKELETAL: Normal range of motion. No tenderness.  No cyanosis, clubbing, or edema.  2+ distal pulses. NEUROLOGIC: Alert and oriented to person, place, and time. Normal reflexes, muscle tone coordination.  PSYCHIATRIC: Normal mood and affect. Normal behavior. Normal judgment and thought content. CARDIOVASCULAR: Normal heart rate noted, regular rhythm RESPIRATORY: Clear to auscultation bilaterally. Effort and breath sounds normal, no problems with respiration noted. BREASTS: Symmetric in size. No masses, tenderness, skin changes, nipple drainage, or lymphadenopathy bilaterally. ABDOMEN: Soft, no distention noted.  No tenderness, rebound or guarding.  PELVIC: Normal appearing external genitalia  With normal atrophy. Nomrmalurethral meatus; normal appearing vaginal mucosa and cervix.  No abnormal discharge noted.  Pap smear obtained.  Normal uterine size, no other palpable masses, no uterine or adnexal tenderness.   Assessment and Plan:  Annual Well Women GYN Exam  Pap not due Mammogram not due 2021 Labs -colonoscopy ordered Meds: change to zoloft, phentamine,Vitamin B 12.   Routine preventative health maintenance measures emphasized. Please refer to After Visit Summary for other counseling recommendations.  Will start weight loss program. Discussed dietary changes and increase in exercise from 2 wk to 3-4 times weekly. Pt will give herself her own injections(did it in the past).  Follow up 1 month for BP/Weight check      Philip Aspen, CNM

## 2019-05-31 ENCOUNTER — Encounter: Payer: Self-pay | Admitting: *Deleted

## 2019-06-14 ENCOUNTER — Other Ambulatory Visit: Payer: Self-pay

## 2019-06-14 ENCOUNTER — Encounter: Payer: Self-pay | Admitting: Certified Nurse Midwife

## 2019-06-14 ENCOUNTER — Ambulatory Visit (INDEPENDENT_AMBULATORY_CARE_PROVIDER_SITE_OTHER): Payer: BC Managed Care – PPO | Admitting: Certified Nurse Midwife

## 2019-06-14 VITALS — BP 106/74 | HR 88 | Ht 65.0 in | Wt 130.0 lb

## 2019-06-14 DIAGNOSIS — Z7689 Persons encountering health services in other specified circumstances: Secondary | ICD-10-CM

## 2019-06-14 MED ORDER — PHENTERMINE HCL 37.5 MG PO TABS: 37.5000 mg | ORAL_TABLET | Freq: Every day | ORAL | 0 refills | Status: DC

## 2019-06-14 MED ORDER — CYANOCOBALAMIN 1000 MCG/ML IJ SOLN: 1000.0000 ug | INTRAMUSCULAR | 1 refills | Status: DC

## 2019-06-14 NOTE — Progress Notes (Signed)
SUBJECTIVE:  53 y.o. here for follow-up weight loss visit, previously seen 4 weeks ago. Denies any concerns and feels like medication is working. She denies any chest pain, palpitations or shortness of breath. She states this past month difficult due to holidays. Discussed recommended wt loss 5% over 3 months and discontinuation of medication if she is unable to meet goal. She verbalizes understanding.   OBJECTIVE:  BP 106/74   Pulse 88   Ht 5\' 5"  (1.651 m)   Wt 130 lb (59 kg)   BMI 21.63 kg/m   Body mass index is 21.63 kg/m. Patient appears well. ASSESSMENT:  Obesity- responding well to weight loss plan PLAN:  To continue with current medications. B12 1024mcg/ml injection.  RTC in 4 weeks as planned  Philip Aspen, CNM

## 2019-06-14 NOTE — Patient Instructions (Signed)
° °Calorie Counting for Weight Loss °Calories are units of energy. Your body needs a certain amount of calories from food to keep you going throughout the day. When you eat more calories than your body needs, your body stores the extra calories as fat. When you eat fewer calories than your body needs, your body burns fat to get the energy it needs. °Calorie counting means keeping track of how many calories you eat and drink each day. Calorie counting can be helpful if you need to lose weight. If you make sure to eat fewer calories than your body needs, you should lose weight. Ask your health care provider what a healthy weight is for you. °For calorie counting to work, you will need to eat the right number of calories in a day in order to lose a healthy amount of weight per week. A dietitian can help you determine how many calories you need in a day and will give you suggestions on how to reach your calorie goal. °· A healthy amount of weight to lose per week is usually 1-2 lb (0.5-0.9 kg). This usually means that your daily calorie intake should be reduced by 500-750 calories. °· Eating 1,200 - 1,500 calories per day can help most women lose weight. °· Eating 1,500 - 1,800 calories per day can help most men lose weight. °What is my plan? °My goal is to have __________ calories per day. °If I have this many calories per day, I should lose around __________ pounds per week. °What do I need to know about calorie counting? °In order to meet your daily calorie goal, you will need to: °· Find out how many calories are in each food you would like to eat. Try to do this before you eat. °· Decide how much of the food you plan to eat. °· Write down what you ate and how many calories it had. Doing this is called keeping a food log. °To successfully lose weight, it is important to balance calorie counting with a healthy lifestyle that includes regular activity. Aim for 150 minutes of moderate exercise (such as walking) or 75  minutes of vigorous exercise (such as running) each week. °Where do I find calorie information? ° °The number of calories in a food can be found on a Nutrition Facts label. If a food does not have a Nutrition Facts label, try to look up the calories online or ask your dietitian for help. °Remember that calories are listed per serving. If you choose to have more than one serving of a food, you will have to multiply the calories per serving by the amount of servings you plan to eat. For example, the label on a package of bread might say that a serving size is 1 slice and that there are 90 calories in a serving. If you eat 1 slice, you will have eaten 90 calories. If you eat 2 slices, you will have eaten 180 calories. °How do I keep a food log? °Immediately after each meal, record the following information in your food log: °· What you ate. Don't forget to include toppings, sauces, and other extras on the food. °· How much you ate. This can be measured in cups, ounces, or number of items. °· How many calories each food and drink had. °· The total number of calories in the meal. °Keep your food log near you, such as in a small notebook in your pocket, or use a mobile app or website. Some programs will   calculate calories for you and show you how many calories you have left for the day to meet your goal. °What are some calorie counting tips? ° °· Use your calories on foods and drinks that will fill you up and not leave you hungry: °? Some examples of foods that fill you up are nuts and nut butters, vegetables, lean proteins, and high-fiber foods like whole grains. High-fiber foods are foods with more than 5 g fiber per serving. °? Drinks such as sodas, specialty coffee drinks, alcohol, and juices have a lot of calories, yet do not fill you up. °· Eat nutritious foods and avoid empty calories. Empty calories are calories you get from foods or beverages that do not have many vitamins or protein, such as candy, sweets, and  soda. It is better to have a nutritious high-calorie food (such as an avocado) than a food with few nutrients (such as a bag of chips). °· Know how many calories are in the foods you eat most often. This will help you calculate calorie counts faster. °· Pay attention to calories in drinks. Low-calorie drinks include water and unsweetened drinks. °· Pay attention to nutrition labels for "low fat" or "fat free" foods. These foods sometimes have the same amount of calories or more calories than the full fat versions. They also often have added sugar, starch, or salt, to make up for flavor that was removed with the fat. °· Find a way of tracking calories that works for you. Get creative. Try different apps or programs if writing down calories does not work for you. °What are some portion control tips? °· Know how many calories are in a serving. This will help you know how many servings of a certain food you can have. °· Use a measuring cup to measure serving sizes. You could also try weighing out portions on a kitchen scale. With time, you will be able to estimate serving sizes for some foods. °· Take some time to put servings of different foods on your favorite plates, bowls, and cups so you know what a serving looks like. °· Try not to eat straight from a bag or box. Doing this can lead to overeating. Put the amount you would like to eat in a cup or on a plate to make sure you are eating the right portion. °· Use smaller plates, glasses, and bowls to prevent overeating. °· Try not to multitask (for example, watch TV or use your computer) while eating. If it is time to eat, sit down at a table and enjoy your food. This will help you to know when you are full. It will also help you to be aware of what you are eating and how much you are eating. °What are tips for following this plan? °Reading food labels °· Check the calorie count compared to the serving size. The serving size may be smaller than what you are used to  eating. °· Check the source of the calories. Make sure the food you are eating is high in vitamins and protein and low in saturated and trans fats. °Shopping °· Read nutrition labels while you shop. This will help you make healthy decisions before you decide to purchase your food. °· Make a grocery list and stick to it. °Cooking °· Try to cook your favorite foods in a healthier way. For example, try baking instead of frying. °· Use low-fat dairy products. °Meal planning °· Use more fruits and vegetables. Half of your plate should be   fruits and vegetables. °· Include lean proteins like poultry and fish. °How do I count calories when eating out? °· Ask for smaller portion sizes. °· Consider sharing an entree and sides instead of getting your own entree. °· If you get your own entree, eat only half. Ask for a box at the beginning of your meal and put the rest of your entree in it so you are not tempted to eat it. °· If calories are listed on the menu, choose the lower calorie options. °· Choose dishes that include vegetables, fruits, whole grains, low-fat dairy products, and lean protein. °· Choose items that are boiled, broiled, grilled, or steamed. Stay away from items that are buttered, battered, fried, or served with cream sauce. Items labeled "crispy" are usually fried, unless stated otherwise. °· Choose water, low-fat milk, unsweetened iced tea, or other drinks without added sugar. If you want an alcoholic beverage, choose a lower calorie option such as a glass of wine or light beer. °· Ask for dressings, sauces, and syrups on the side. These are usually high in calories, so you should limit the amount you eat. °· If you want a salad, choose a garden salad and ask for grilled meats. Avoid extra toppings like bacon, cheese, or fried items. Ask for the dressing on the side, or ask for olive oil and vinegar or lemon to use as dressing. °· Estimate how many servings of a food you are given. For example, a serving of  cooked rice is ½ cup or about the size of half a baseball. Knowing serving sizes will help you be aware of how much food you are eating at restaurants. The list below tells you how big or small some common portion sizes are based on everyday objects: °? 1 oz--4 stacked dice. °? 3 oz--1 deck of cards. °? 1 tsp--1 die. °? 1 Tbsp--½ a ping-pong ball. °? 2 Tbsp--1 ping-pong ball. °? ½ cup--½ baseball. °? 1 cup--1 baseball. °Summary °· Calorie counting means keeping track of how many calories you eat and drink each day. If you eat fewer calories than your body needs, you should lose weight. °· A healthy amount of weight to lose per week is usually 1-2 lb (0.5-0.9 kg). This usually means reducing your daily calorie intake by 500-750 calories. °· The number of calories in a food can be found on a Nutrition Facts label. If a food does not have a Nutrition Facts label, try to look up the calories online or ask your dietitian for help. °· Use your calories on foods and drinks that will fill you up, and not on foods and drinks that will leave you hungry. °· Use smaller plates, glasses, and bowls to prevent overeating. °This information is not intended to replace advice given to you by your health care provider. Make sure you discuss any questions you have with your health care provider. °Document Released: 06/02/2005 Document Revised: 02/19/2018 Document Reviewed: 05/02/2016 °Elsevier Patient Education © 2020 [employer] ° °

## 2019-06-22 ENCOUNTER — Other Ambulatory Visit: Payer: Self-pay | Admitting: Certified Nurse Midwife

## 2019-06-24 ENCOUNTER — Other Ambulatory Visit: Payer: Self-pay | Admitting: Certified Nurse Midwife

## 2019-06-24 DIAGNOSIS — G4700 Insomnia, unspecified: Secondary | ICD-10-CM

## 2019-07-01 ENCOUNTER — Telehealth: Payer: Self-pay

## 2019-07-01 NOTE — Telephone Encounter (Signed)
error 

## 2019-07-01 NOTE — Telephone Encounter (Signed)
Voicemail message left for patient- fax received from [employer] regarding phentermine. The PA done is NOT authorized. This medicine is not covered by the insurance due to patient not meeting prior authorization requirements.

## 2019-07-13 DIAGNOSIS — G4700 Insomnia, unspecified: Secondary | ICD-10-CM | POA: Diagnosis not present

## 2019-07-15 ENCOUNTER — Ambulatory Visit: Payer: BC Managed Care – PPO | Admitting: Certified Nurse Midwife

## 2019-07-15 ENCOUNTER — Other Ambulatory Visit: Payer: Self-pay

## 2019-07-15 ENCOUNTER — Encounter: Payer: Self-pay | Admitting: Certified Nurse Midwife

## 2019-07-15 VITALS — BP 124/72 | HR 94 | Ht 65.0 in | Wt 128.0 lb

## 2019-07-15 DIAGNOSIS — Z7689 Persons encountering health services in other specified circumstances: Secondary | ICD-10-CM

## 2019-07-15 DIAGNOSIS — Z6821 Body mass index (BMI) 21.0-21.9, adult: Secondary | ICD-10-CM | POA: Diagnosis not present

## 2019-07-15 DIAGNOSIS — D519 Vitamin B12 deficiency anemia, unspecified: Secondary | ICD-10-CM | POA: Diagnosis not present

## 2019-07-15 DIAGNOSIS — Z713 Dietary counseling and surveillance: Secondary | ICD-10-CM

## 2019-07-15 MED ORDER — PHENTERMINE HCL 37.5 MG PO TABS: 37.5000 mg | ORAL_TABLET | Freq: Every day | ORAL | 0 refills | Status: DC

## 2019-07-15 MED ORDER — TETANUS-DIPHTH-ACELL PERTUSSIS 5-2.5-18.5 LF-MCG/0.5 IM SUSP
0.5000 mL | Freq: Once | INTRAMUSCULAR | Status: AC
  Administered 2019-07-15: 10:00:00 0.5 mL via INTRAMUSCULAR

## 2019-07-15 NOTE — Progress Notes (Signed)
SUBJECTIVE:  54 y.o. here for follow-up weight loss visit, previously seen 4 weeks ago. Denies any concerns and feels like medication is working well. She denies any negative side effects. She has been using medication x 3 months and has not yet met goal for 5% but given this was over the holiday discussed giving her another month. IF she continues to lose wt we will move forward with medications. If she does not lose at her next visit then we will stop the medications.  Her total wt loss goal was 5-6 lbs. She verbalizes and agrees to plan.   OBJECTIVE:  BP 124/72   Pulse 94   Ht '5\' 5"'  (1.651 m)   Wt 128 lb (58.1 kg)   BMI 21.30 kg/m   Body mass index is 21.3 kg/m. Patient appears well. ASSESSMENT:  Obesity- responding well to weight loss plan PLAN:  To continue with current medications. B12 1050mg/ml injection given RTC in 4 weeks as planned  I attest more than 50% of visit spent reviewing history, discussing diet , exercise, and medication side effects. Developing on going plan. Face to face time 10 min.   APhilip Aspen CNM

## 2019-07-15 NOTE — Progress Notes (Signed)
Waist circum. 29 inches

## 2019-07-15 NOTE — Patient Instructions (Signed)

## 2019-07-19 DIAGNOSIS — L700 Acne vulgaris: Secondary | ICD-10-CM | POA: Diagnosis not present

## 2019-07-21 ENCOUNTER — Ambulatory Visit
Admission: EM | Admit: 2019-07-21 | Discharge: 2019-07-21 | Disposition: A | Discharge status: Home / Self Care | Payer: BC Managed Care – PPO | Attending: Emergency Medicine | Admitting: Emergency Medicine

## 2019-07-21 ENCOUNTER — Other Ambulatory Visit: Payer: Self-pay

## 2019-07-21 DIAGNOSIS — M545 Low back pain, unspecified: Secondary | ICD-10-CM

## 2019-07-21 DIAGNOSIS — M25551 Pain in right hip: Secondary | ICD-10-CM

## 2019-07-21 DIAGNOSIS — R590 Localized enlarged lymph nodes: Secondary | ICD-10-CM | POA: Diagnosis not present

## 2019-07-21 MED ORDER — IBUPROFEN 800 MG PO TABS: 800.0000 mg | ORAL_TABLET | Freq: Three times a day (TID) | ORAL | 0 refills | Status: DC | PRN

## 2019-07-21 MED ORDER — CYCLOBENZAPRINE HCL 10 MG PO TABS: 10.0000 mg | ORAL_TABLET | Freq: Two times a day (BID) | ORAL | 0 refills | Status: DC | PRN

## 2019-07-21 NOTE — Discharge Instructions (Signed)
Monitor the swollen area in your groin.  If it is not getting smaller and going away, follow up with your primary care provider in 1-2 weeks.    Take the prescribed ibuprofen as needed for your pain.  Take the muscle relaxer Flexeril as needed for muscle spasm; do not drive, operate machinery, or drink alcohol with this medication as it may make you drowsy.    Follow up with your primary care provider if your pain is not improving.    Go to the emergency department if you have worsening pain or develop new symptoms such as difficulty with urination, weakness, numbness, loss of control of your bladder or bowels, fever, chills or other concerns.

## 2019-07-21 NOTE — ED Provider Notes (Signed)
[name]    CSN: [id number] Arrival date & time: 07/21/19  1549      History   Chief Complaint Chief Complaint  Patient presents with  . Back Pain    HPI Linda Ali is a 54 y.o. female.   Patient presents with right lower back pain, right hip pain, a "lump" in her right groin x3 weeks.  She states her symptoms began after she was assisting her father up from the floor.  She denies numbness, weakness, paresthesias, fever, chills, cough, shortness of breath, vomiting, diarrhea, abdominal pain, dysuria, vaginal discharge, pelvic pain, or other symptoms.    The history is provided by the patient.    Past Medical History:  Diagnosis Date  . Anxiety   . Depression   . Dyspareunia in female   . Insomnia   . Vitamin D deficiency     Patient Active Problem List   Diagnosis Date Noted  . Vitamin D deficiency 03/11/2016  . Menopause 03/11/2016  . Anxiety 12/06/2014  . Cannot sleep 12/06/2014    Past Surgical History:  Procedure Laterality Date  . CESAREAN SECTION  7510,2585    OB History    Gravida  2   Para      Term      Preterm      AB      Living        SAB      TAB      Ectopic      Multiple      Live Births               Home Medications    Prior to Admission medications   Medication Sig Start Date End Date Taking? Authorizing Provider  cyanocobalamin (,VITAMIN B-12,) 1000 MCG/ML injection Inject 1 mL (1,000 mcg total) into the muscle every 30 (thirty) days. 06/14/19  Yes Doreene Burke, CNM  MULTIPLE VITAMIN PO Take 1 tablet by mouth daily.   Yes [provider]  phentermine (ADIPEX-P) 37.5 MG tablet Take 1 tablet (37.5 mg total) by mouth daily. 07/15/19  Yes Doreene Burke, CNM  SUMAtriptan (IMITREX) 50 MG tablet TAKE 1 TABLET BY MOUTH EVERY 2 HRS AS NEEDED FOR MIGRAINE. REPEAT IN 2 HRS IF HEADACHE PERSISTS OR RECURS 10/05/18  Yes Lawhorn, Vanessa Kranzburg, CNM  traZODone (DESYREL) 150 MG tablet TAKE 1 TABLET  (150 MG TOTAL) BY MOUTH AT BEDTIME. 06/24/19  Yes Doreene Burke, CNM  Vitamin D, Ergocalciferol, (DRISDOL) 1.25 MG (50000 UT) CAPS capsule Take 1 capsule (50,000 Units total) by mouth 2 (two) times a week. 04/29/18  Yes Shambley, Melody N, CNM  cyclobenzaprine (FLEXERIL) 10 MG tablet Take 1 tablet (10 mg total) by mouth 2 (two) times daily as needed for muscle spasms. 07/21/19   Mickie Bail, NP  estradiol (ESTRACE VAGINAL) 0.1 MG/GM vaginal cream Place 2 g vaginally daily. 04/29/18   Shambley, Melody N, CNM  ibuprofen (ADVIL) 800 MG tablet Take 1 tablet (800 mg total) by mouth every 8 (eight) hours as needed. 07/21/19   Mickie Bail, NP    Family History Family History  Problem Relation Age of Onset  . Parkinson's disease Father   . Lymphoma Father   . Breast cancer Neg Hx     Social History Social History   Tobacco Use  . Smoking status: Never Smoker  . Smokeless tobacco: Never Used  Substance Use Topics  . Alcohol use: Yes    Alcohol/week: 0.0 standard drinks  Comment: OCCASIONALLY  . Drug use: No     Allergies   Patient has no known allergies.   Review of Systems Review of Systems  Constitutional: Negative for chills and fever.  HENT: Negative for ear pain and sore throat.   Eyes: Negative for pain and visual disturbance.  Respiratory: Negative for cough and shortness of breath.   Cardiovascular: Negative for chest pain and palpitations.  Gastrointestinal: Negative for abdominal pain, diarrhea, nausea and vomiting.  Genitourinary: Negative for dysuria, flank pain, hematuria, pelvic pain and vaginal discharge.  Musculoskeletal: Positive for arthralgias and back pain.  Skin: Negative for color change and rash.  Neurological: Negative for seizures, syncope, weakness and numbness.  All other systems reviewed and are negative.    Physical Exam Triage Vital Signs ED Triage Vitals  Enc Vitals Group     BP      Pulse      Resp      Temp      Temp src      SpO2       Weight      Height      Head Circumference      Peak Flow      Pain Score      Pain Loc      Pain Edu?      Excl. in GC?    No data found.  Updated Vital Signs BP 107/75 (BP Location: Left Arm)   Pulse 93   Temp 98.8 F (37.1 C) (Oral)   Resp 16   Ht 5\' 5"  (1.651 m)   Wt 127 lb (57.6 kg)   SpO2 97%   BMI 21.13 kg/m   Visual Acuity Right Eye Distance:   Left Eye Distance:   Bilateral Distance:    Right Eye Near:   Left Eye Near:    Bilateral Near:     Physical Exam Vitals and nursing note reviewed.  Constitutional:      General: She is not in acute distress.    Appearance: She is well-developed.  HENT:     Head: Normocephalic and atraumatic.     Mouth/Throat:     Mouth: Mucous membranes are moist.     Pharynx: Oropharynx is clear.  Eyes:     Conjunctiva/sclera: Conjunctivae normal.  Cardiovascular:     Rate and Rhythm: Normal rate and regular rhythm.     Heart sounds: No murmur.  Pulmonary:     Effort: Pulmonary effort is normal. No respiratory distress.     Breath sounds: Normal breath sounds.  Abdominal:     General: Bowel sounds are normal.     Palpations: Abdomen is soft.     Tenderness: There is no abdominal tenderness. There is no right CVA tenderness, left CVA tenderness, guarding or rebound.     Comments: 2 cm tender, mobile, soft enlarged mass in right inguinal area;  Does not increase in size with cough or bearing down;  Does not reduce with palpation.    Musculoskeletal:        General: Normal range of motion.     Cervical back: Neck supple.  Skin:    General: Skin is warm and dry.     Capillary Refill: Capillary refill takes less than 2 seconds.     Findings: No bruising, erythema or rash.  Neurological:     General: No focal deficit present.     Mental Status: She is alert and oriented to person, place, and time.  Sensory: No sensory deficit.     Motor: No weakness.     Gait: Gait normal.  Psychiatric:        Mood and Affect:  Mood normal.        Behavior: Behavior normal.      UC Treatments / Results  Labs (all labs ordered are listed, but only abnormal results are displayed) Labs Reviewed - No data to display  EKG   Radiology No results found.  Procedures Procedures (including critical care time)  Medications Ordered in UC Medications - No data to display  Initial Impression / Assessment and Plan / UC Course  I have reviewed the triage vital signs and the nursing notes.  Pertinent labs & imaging results that were available during my care of the patient were reviewed by me and considered in my medical decision making (see chart for details).   Acute right-sided low back pain without sciatica, right hip pain, right inguinal lymphadenopathy.  Instructed patient to monitor the area in her groin and to follow-up with her PCP if it is not resolving in the next 1 to 2 weeks.  Treating her back pain and hip pain with ibuprofen and Flexeril.  Precautions for drowsiness with Flexeril discussed with patient.  Instructed patient to follow-up with her PCP if her pain is not improving.  Instructed her to go to the emergency department if she has increased pain or develops new symptoms such as dysuria, weakness, numbness, bowel/bladder incontinence, fever, chills, or other concerns.  Patient agrees to this plan of care.     Final Clinical Impressions(s) / UC Diagnoses   Final diagnoses:  Acute right-sided low back pain without sciatica  Hip pain, right  Lymphadenopathy, inguinal     Discharge Instructions     Monitor the swollen area in your groin.  If it is not getting smaller and going away, follow up with your primary care provider in 1-2 weeks.    Take the prescribed ibuprofen as needed for your pain.  Take the muscle relaxer Flexeril as needed for muscle spasm; do not drive, operate machinery, or drink alcohol with this medication as it may make you drowsy.    Follow up with your primary care provider  if your pain is not improving.    Go to the emergency department if you have worsening pain or develop new symptoms such as difficulty with urination, weakness, numbness, loss of control of your bladder or bowels, fever, chills or other concerns.     ED Prescriptions    Medication Sig Dispense Auth. Provider   cyclobenzaprine (FLEXERIL) 10 MG tablet Take 1 tablet (10 mg total) by mouth 2 (two) times daily as needed for muscle spasms. 20 tablet Sharion Balloon, NP   ibuprofen (ADVIL) 800 MG tablet Take 1 tablet (800 mg total) by mouth every 8 (eight) hours as needed. 21 tablet Sharion Balloon, NP     PDMP not reviewed this encounter.   Sharion Balloon, NP 07/21/19 [phone]

## 2019-07-21 NOTE — ED Triage Notes (Signed)
Patient states that around 3 weeks ago that started after she picked up her father from the floor. Patient reports that she has noticed the low back pain move to her right hip. States that the pain is worse when she is moving in and out of the car. Also states that she noticed a swollen area at right groin.

## 2019-07-22 ENCOUNTER — Encounter: Payer: Self-pay | Admitting: Physician Assistant

## 2019-07-22 ENCOUNTER — Other Ambulatory Visit: Payer: Self-pay

## 2019-07-22 ENCOUNTER — Ambulatory Visit: Payer: BC Managed Care – PPO | Admitting: Physician Assistant

## 2019-07-22 VITALS — BP 111/75 | HR 91 | Temp 96.9°F | Resp 16 | Wt 131.0 lb

## 2019-07-22 DIAGNOSIS — R590 Localized enlarged lymph nodes: Secondary | ICD-10-CM

## 2019-07-22 DIAGNOSIS — M549 Dorsalgia, unspecified: Secondary | ICD-10-CM | POA: Diagnosis not present

## 2019-07-22 DIAGNOSIS — M5441 Lumbago with sciatica, right side: Secondary | ICD-10-CM

## 2019-07-22 MED ORDER — METHOCARBAMOL 500 MG PO TABS: 500.0000 mg | ORAL_TABLET | Freq: Three times a day (TID) | ORAL | 0 refills | Status: DC | PRN

## 2019-07-22 MED ORDER — MELOXICAM 15 MG PO TABS: 15.0000 mg | ORAL_TABLET | Freq: Every day | ORAL | 0 refills | Status: DC

## 2019-07-22 NOTE — Progress Notes (Signed)
Patient: Linda Ali Female    DOB: Oct 29, 1965   54 y.o.   MRN: [id number] Visit Date: 07/22/2019  Today's Provider: Mar Daring, PA-C   Chief Complaint  Patient presents with  . Cyst   Subjective:     HPI  Patient here with c/o lump/cyst groin area for a week. She went to urgent care yesterday and was seen for her back and hip and was advised to come see her PCP for the "lump' in groin area. She did have her covid vaccine (Moderna) on 07/03/19 in the right arm and not sure if it may be from that. Noticed the lump about 1 week ago. Tender to palpation. No overlying redness. Mild warmth.   Also still having back pain that radiates to the right hip. This pain started after lifting her father off the floor. She was started on IBU 800 mg and flexeril. IBU is not helping pain. Flexeril makes her drowsy.   No Known Allergies   Current Outpatient Medications:  .  cyanocobalamin (,VITAMIN B-12,) 1000 MCG/ML injection, Inject 1 mL (1,000 mcg total) into the muscle every 30 (thirty) days., Disp: 10 mL, Rfl: 1 .  cyclobenzaprine (FLEXERIL) 10 MG tablet, Take 1 tablet (10 mg total) by mouth 2 (two) times daily as needed for muscle spasms., Disp: 20 tablet, Rfl: 0 .  estradiol (ESTRACE VAGINAL) 0.1 MG/GM vaginal cream, Place 2 g vaginally daily., Disp: 42.5 g, Rfl: 2 .  ibuprofen (ADVIL) 800 MG tablet, Take 1 tablet (800 mg total) by mouth every 8 (eight) hours as needed., Disp: 21 tablet, Rfl: 0 .  MULTIPLE VITAMIN PO, Take 1 tablet by mouth daily., Disp: , Rfl:  .  phentermine (ADIPEX-P) 37.5 MG tablet, Take 1 tablet (37.5 mg total) by mouth daily., Disp: 30 tablet, Rfl: 0 .  SUMAtriptan (IMITREX) 50 MG tablet, TAKE 1 TABLET BY MOUTH EVERY 2 HRS AS NEEDED FOR MIGRAINE. REPEAT IN 2 HRS IF HEADACHE PERSISTS OR RECURS, Disp: 9 tablet, Rfl: 1 .  traZODone (DESYREL) 150 MG tablet, TAKE 1 TABLET (150 MG TOTAL) BY MOUTH AT BEDTIME., Disp: 30 tablet, Rfl: 0 .  Vitamin D,  Ergocalciferol, (DRISDOL) 1.25 MG (50000 UT) CAPS capsule, Take 1 capsule (50,000 Units total) by mouth 2 (two) times a week., Disp: 30 capsule, Rfl: 2  Review of Systems  Constitutional: Negative.  Negative for fever.  Respiratory: Negative.   Cardiovascular: Negative.   Gastrointestinal: Negative for abdominal pain, nausea and vomiting.  Genitourinary: Negative for dysuria, genital sores, pelvic pain, vaginal bleeding, vaginal discharge and vaginal pain.  Musculoskeletal: Positive for arthralgias, back pain and myalgias.  Neurological: Negative for weakness and numbness.  Hematological: Positive for adenopathy.    Social History   Tobacco Use  . Smoking status: Never Smoker  . Smokeless tobacco: Never Used  Substance Use Topics  . Alcohol use: Yes    Alcohol/week: 0.0 standard drinks    Comment: OCCASIONALLY      Objective:   BP 111/75 (BP Location: Left Arm, Patient Position: Sitting, Cuff Size: Normal)   Pulse 91   Temp (!) 96.9 F (36.1 C) (Temporal)   Resp 16   Wt 131 lb (59.4 kg)   BMI 21.80 kg/m  Vitals:   07/22/19 1410  BP: 111/75  Pulse: 91  Resp: 16  Temp: (!) 96.9 F (36.1 C)  TempSrc: Temporal  Weight: 131 lb (59.4 kg)  Body mass index is 21.8 kg/m.   Physical Exam  Vitals reviewed.  Constitutional:      General: She is not in acute distress.    Appearance: She is well-developed and normal weight.  HENT:     Head: Normocephalic and atraumatic.  Pulmonary:     Effort: Pulmonary effort is normal. No respiratory distress.  Musculoskeletal:     Cervical back: Normal range of motion and neck supple.     Thoracic back: Normal.     Lumbar back: Spasms and tenderness present. No bony tenderness. Normal range of motion. Negative right straight leg raise test and negative left straight leg raise test. No scoliosis.       Back:  Neurological:     Mental Status: She is alert.  Psychiatric:        Mood and Affect: Mood normal.        Behavior: Behavior  normal.        Thought Content: Thought content normal.        Judgment: Judgment normal.      No results found for any visits on 07/22/19.     Assessment & Plan    1. Trigger point with back pain Will change from IBU to Meloxicam as below. Offered steroid taper, but patient declined due to side effects. Will change Flexeril to Robaxin as below to limit drowsiness side effects. Advised may apply heat to the area. Epsom salt soaks can help also. Massages will help. Will get imaging to r/o bony abnormality. Call if worsening.  - meloxicam (MOBIC) 15 MG tablet; Take 1 tablet (15 mg total) by mouth daily.  Dispense: 30 tablet; Refill: 0 - methocarbamol (ROBAXIN) 500 MG tablet; Take 1 tablet (500 mg total) by mouth every 8 (eight) hours as needed for muscle spasms.  Dispense: 30 tablet; Refill: 0  2. Inguinal lymphadenopathy Noted. Could be reactive to covid 19 vaccination vs reactive to injury. Will monitor and use NSAID and heat. If not improving over next week will get [place].  - meloxicam (MOBIC) 15 MG tablet; Take 1 tablet (15 mg total) by mouth daily.  Dispense: 30 tablet; Refill: 0  3. Acute midline low back pain with right-sided sciatica See above medical treatment plan for #1. - DG Lumbar Spine Complete; Future     Margaretann Loveless, PA-C  [facility]

## 2019-07-22 NOTE — Patient Instructions (Signed)

## 2019-07-24 ENCOUNTER — Other Ambulatory Visit: Payer: Self-pay | Admitting: Certified Nurse Midwife

## 2019-07-24 DIAGNOSIS — G4700 Insomnia, unspecified: Secondary | ICD-10-CM

## 2019-07-25 ENCOUNTER — Ambulatory Visit
Admission: RE | Admit: 2019-07-25 | Discharge: 2019-07-25 | Disposition: A | Discharge status: Home / Self Care | Payer: BC Managed Care – PPO | Source: Ambulatory Visit | Attending: Physician Assistant | Admitting: Physician Assistant

## 2019-07-25 ENCOUNTER — Other Ambulatory Visit: Payer: Self-pay

## 2019-07-25 ENCOUNTER — Telehealth: Payer: Self-pay

## 2019-07-25 DIAGNOSIS — M5441 Lumbago with sciatica, right side: Secondary | ICD-10-CM

## 2019-07-25 DIAGNOSIS — M545 Low back pain: Secondary | ICD-10-CM | POA: Diagnosis not present

## 2019-07-25 NOTE — Telephone Encounter (Signed)
LMTCB-Ok for [facility] triage nurse to give message. Thanks.

## 2019-07-25 NOTE — Telephone Encounter (Signed)
-----   Message from Margaretann Loveless, [place] sent at 07/25/2019 10:51 AM EST ----- There is a noted compression fracture in L2, but the age of the fracture is not determined. It is possible this could be from lifting your dad. Is the medications helping? If not, could consider a referral to Dr. Rosita Kea, orthopedics for further evaluation and to discuss treatment options for the compression fracture.

## 2019-07-26 ENCOUNTER — Telehealth: Payer: Self-pay | Admitting: *Deleted

## 2019-07-26 NOTE — Telephone Encounter (Signed)
I would recommend 14 days. If symptoms worsen or are not getting better please call.

## 2019-07-26 NOTE — Telephone Encounter (Signed)
Sent activation code for MyChart as discussed with patient.

## 2019-07-26 NOTE — Telephone Encounter (Signed)
Reviewed physician's note regarding Xray results. She is taking tylenol during the day and flexeril at night. Rates the pain at 5-6 at this time. She would like to continue the medications for awhile longer before any referral.  She is requesting provider's timeline as to how long she should continue the tylenol/advil/flexeril before considering the ortho referral? Routing to provider for response.

## 2019-07-27 ENCOUNTER — Telehealth: Payer: Self-pay | Admitting: Physician Assistant

## 2019-07-27 DIAGNOSIS — S32020D Wedge compression fracture of second lumbar vertebra, subsequent encounter for fracture with routine healing: Secondary | ICD-10-CM

## 2019-07-27 NOTE — Telephone Encounter (Signed)
Pt advised.  See phone message 07/26/2019  Thanks,   -Vernona Rieger

## 2019-07-27 NOTE — Telephone Encounter (Signed)
Copied from CRM [id number]. Topic: Referral - Request for Referral >> Jul 27, 2019  2:20 PM Tamela Oddi wrote: Has patient seen PCP for this complaint? yes Referral for which specialty: Specialist for back pain Preferred provider/office: Doctor recommendation Reason for referral: Back pain is worse

## 2019-07-27 NOTE — Telephone Encounter (Signed)
Pt advised.   Thanks,   -Linda Ali  

## 2019-07-28 NOTE — Telephone Encounter (Signed)
Referral placed.

## 2019-08-03 ENCOUNTER — Other Ambulatory Visit: Payer: Self-pay | Admitting: Orthopedic Surgery

## 2019-08-03 DIAGNOSIS — S32020A Wedge compression fracture of second lumbar vertebra, initial encounter for closed fracture: Secondary | ICD-10-CM | POA: Diagnosis not present

## 2019-08-03 DIAGNOSIS — M5416 Radiculopathy, lumbar region: Secondary | ICD-10-CM | POA: Diagnosis not present

## 2019-08-04 ENCOUNTER — Other Ambulatory Visit: Payer: Self-pay

## 2019-08-04 ENCOUNTER — Ambulatory Visit
Admission: RE | Admit: 2019-08-04 | Discharge: 2019-08-04 | Disposition: A | Discharge status: Home / Self Care | Payer: BC Managed Care – PPO | Source: Ambulatory Visit | Attending: Orthopedic Surgery | Admitting: Orthopedic Surgery

## 2019-08-04 DIAGNOSIS — S32020A Wedge compression fracture of second lumbar vertebra, initial encounter for closed fracture: Secondary | ICD-10-CM | POA: Diagnosis not present

## 2019-08-04 DIAGNOSIS — M545 Low back pain: Secondary | ICD-10-CM | POA: Diagnosis not present

## 2019-08-09 DIAGNOSIS — M810 Age-related osteoporosis without current pathological fracture: Secondary | ICD-10-CM | POA: Diagnosis not present

## 2019-08-16 ENCOUNTER — Encounter: Payer: BC Managed Care – PPO | Admitting: Certified Nurse Midwife

## 2019-08-17 ENCOUNTER — Other Ambulatory Visit: Payer: Self-pay | Admitting: Orthopedic Surgery

## 2019-08-17 DIAGNOSIS — S32020G Wedge compression fracture of second lumbar vertebra, subsequent encounter for fracture with delayed healing: Secondary | ICD-10-CM | POA: Diagnosis not present

## 2019-08-23 ENCOUNTER — Other Ambulatory Visit: Payer: Self-pay | Admitting: Certified Nurse Midwife

## 2019-08-23 ENCOUNTER — Encounter
Admission: RE | Admit: 2019-08-23 | Discharge: 2019-08-23 | Disposition: A | Discharge status: Home / Self Care | Payer: BC Managed Care – PPO | Source: Ambulatory Visit | Attending: Orthopedic Surgery | Admitting: Orthopedic Surgery

## 2019-08-23 ENCOUNTER — Encounter: Payer: Self-pay | Admitting: Orthopedic Surgery

## 2019-08-23 ENCOUNTER — Other Ambulatory Visit: Payer: Self-pay

## 2019-08-23 DIAGNOSIS — Z01818 Encounter for other preprocedural examination: Secondary | ICD-10-CM | POA: Diagnosis not present

## 2019-08-23 DIAGNOSIS — G4700 Insomnia, unspecified: Secondary | ICD-10-CM

## 2019-08-23 NOTE — Patient Instructions (Signed)
Your procedure is scheduled on: Thurs 3/11 Report to Day Surgery. To find out your arrival time please call [phone] between 1PM - 3PM on Wed 3/10.  Remember: Instructions that are not followed completely may result in serious medical risk,  up to and including death, or upon the discretion of your surgeon and anesthesiologist your  surgery may need to be rescheduled.     _X__ 1. Do not eat food after midnight the night before your procedure.                 No gum chewing or hard candies. You may drink clear liquids up to 2 hours                 before you are scheduled to arrive for your surgery- DO not drink clear                 liquids within 2 hours of the start of your surgery.                 Clear Liquids include:  water, apple juice without pulp, clear Gatorade, G2 or                  Gatorade Zero (avoid Red/Purple/Blue), Black Coffee or Tea (Do not add                 anything to coffee or tea). __x___2.   Complete the carbohydrate drink provided to you, 2 hours before arrival.  __X__2.  On the morning of surgery brush your teeth with toothpaste and water, you                may rinse your mouth with mouthwash if you wish.  Do not swallow any toothpaste of mouthwash.     _X__ 3.  No Alcohol for 24 hours before or after surgery.   ___ 4.  Do Not Smoke or use e-cigarettes For 24 Hours Prior to Your Surgery.                 Do not use any chewable tobacco products for at least 6 hours prior to                 surgery.  ____  5.  Bring all medications with you on the day of surgery if instructed.   __x__  6.  Notify your doctor if there is any change in your medical condition      (cold, fever, infections).     Do not wear jewelry, make-up, hairpins, clips or nail polish. Do not wear lotions, powders, or perfumes. You may wear deodorant. Do not shave 48 hours prior to surgery. Men may shave face and neck. Do not bring valuables to the hospital.     [facility] is not responsible for any belongings or valuables.  Contacts, dentures or bridgework may not be worn into surgery. Leave your suitcase in the car. After surgery it may be brought to your room. For patients admitted to the hospital, discharge time is determined by your treatment team.   Patients discharged the day of surgery will not be allowed to drive home.   Make arrangements for someone to be with you for the first 24 hours of your Same Day Discharge.    Please read over the following fact sheets that you were given:     _x___ Take these medicines the morning of surgery with A SIP OF WATER:      1. acetaminophen (TYLENOL) 500 MG tablet or HYDROcodone-acetaminophen (NORCO/VICODIN) 5-325 MG tablet if needed  2.   3.   4.  5.  6.  ____ Fleet Enema (as directed)   __x__ Use CHG Soap (or wipes) as directed  ____ Use Benzoyl Peroxide Gel as instructed  ____ Use inhalers on the day of surgery  ____ Stop metformin 2 days prior to surgery    ____ Take 1/2 of usual insulin dose the night before surgery. No insulin the morning          of surgery.   ____ Stop Coumadin/Plavix/aspirin on   __x__ Stop Anti-inflammatories no ibuprofen aleve or aspirin until after surgery    _x___ Stop supplements until after surgery.    May continue Vit D and Multi vitamin and Vit B12                                

## 2019-08-23 NOTE — Patient Instructions (Signed)
Your procedure is scheduled on: Thurs 3/11 Report to Day Surgery. To find out your arrival time please call [phone] between 1PM - 3PM on Wed 3/10.  Remember: Instructions that are not followed completely may result in serious medical risk,  up to and including death, or upon the discretion of your surgeon and anesthesiologist your  surgery may need to be rescheduled.     _X__ 1. Do not eat food after midnight the night before your procedure.                 No gum chewing or hard candies. You may drink clear liquids up to 2 hours                 before you are scheduled to arrive for your surgery- DO not drink clear                 liquids within 2 hours of the start of your surgery.                 Clear Liquids include:  water, apple juice without pulp, clear Gatorade, G2 or                  Gatorade Zero (avoid Red/Purple/Blue), Black Coffee or Tea (Do not add                 anything to coffee or tea). __x___2.   Complete the carbohydrate drink provided to you, 2 hours before arrival.  __X__2.  On the morning of surgery brush your teeth with toothpaste and water, you                may rinse your mouth with mouthwash if you wish.  Do not swallow any toothpaste of mouthwash.     _X__ 3.  No Alcohol for 24 hours before or after surgery.   ___ 4.  Do Not Smoke or use e-cigarettes For 24 Hours Prior to Your Surgery.                 Do not use any chewable tobacco products for at least 6 hours prior to                 surgery.  ____  5.  Bring all medications with you on the day of surgery if instructed.   __x__  6.  Notify your doctor if there is any change in your medical condition      (cold, fever, infections).     Do not wear jewelry, make-up, hairpins, clips or nail polish. Do not wear lotions, powders, or perfumes. You may wear deodorant. Do not shave 48 hours prior to surgery. Men may shave face and neck. Do not bring valuables to the hospital.     [facility] is not responsible for any belongings or valuables.  Contacts, dentures or bridgework may not be worn into surgery. Leave your suitcase in the car. After surgery it may be brought to your room. For patients admitted to the hospital, discharge time is determined by your treatment team.   Patients discharged the day of surgery will not be allowed to drive home.   Make arrangements for someone to be with you for the first 24 hours of your Same Day Discharge.    Please read over the following fact sheets that you were given:     _x___ Take these medicines the morning of surgery with A SIP OF WATER:  1. acetaminophen (TYLENOL) 500 MG tablet or HYDROcodone-acetaminophen (NORCO/VICODIN) 5-325 MG tablet if needed  2.   3.   4.  5.  6.  ____ Fleet Enema (as directed)   __x__ Use CHG Soap (or wipes) as directed  ____ Use Benzoyl Peroxide Gel as instructed  ____ Use inhalers on the day of surgery  ____ Stop metformin 2 days prior to surgery    ____ Take 1/2 of usual insulin dose the night before surgery. No insulin the morning          of surgery.   ____ Stop Coumadin/Plavix/aspirin on   __x__ Stop Anti-inflammatories no ibuprofen aleve or aspirin until after surgery    _x___ Stop supplements until after surgery.    May continue Vit D and Multi vitamin and Vit B12

## 2019-08-24 ENCOUNTER — Other Ambulatory Visit
Admission: RE | Admit: 2019-08-24 | Discharge: 2019-08-24 | Disposition: A | Discharge status: Home / Self Care | Payer: BC Managed Care – PPO | Source: Ambulatory Visit | Attending: Orthopedic Surgery | Admitting: Orthopedic Surgery

## 2019-08-24 DIAGNOSIS — Z20822 Contact with and (suspected) exposure to covid-19: Secondary | ICD-10-CM | POA: Diagnosis not present

## 2019-08-24 DIAGNOSIS — Z01812 Encounter for preprocedural laboratory examination: Secondary | ICD-10-CM | POA: Diagnosis not present

## 2019-08-24 LAB — SARS CORONAVIRUS 2 (TAT 6-24 HRS): SARS Coronavirus 2: NEGATIVE

## 2019-08-25 ENCOUNTER — Ambulatory Visit: Payer: BC Managed Care – PPO | Admitting: Certified Registered"

## 2019-08-25 ENCOUNTER — Encounter
Admission: RE | Disposition: A | Discharge status: Home / Self Care | Payer: Self-pay | Source: Home / Self Care | Attending: Orthopedic Surgery

## 2019-08-25 ENCOUNTER — Ambulatory Visit
Admission: RE | Admit: 2019-08-25 | Discharge: 2019-08-25 | Disposition: A | Discharge status: Home / Self Care | Payer: BC Managed Care – PPO | Attending: Orthopedic Surgery | Admitting: Orthopedic Surgery

## 2019-08-25 ENCOUNTER — Encounter: Payer: Self-pay | Admitting: Orthopedic Surgery

## 2019-08-25 ENCOUNTER — Ambulatory Visit: Payer: BC Managed Care – PPO

## 2019-08-25 ENCOUNTER — Other Ambulatory Visit: Payer: Self-pay

## 2019-08-25 DIAGNOSIS — Z419 Encounter for procedure for purposes other than remedying health state, unspecified: Secondary | ICD-10-CM

## 2019-08-25 DIAGNOSIS — G4700 Insomnia, unspecified: Secondary | ICD-10-CM | POA: Insufficient documentation

## 2019-08-25 DIAGNOSIS — X58XXXA Exposure to other specified factors, initial encounter: Secondary | ICD-10-CM | POA: Insufficient documentation

## 2019-08-25 DIAGNOSIS — F329 Major depressive disorder, single episode, unspecified: Secondary | ICD-10-CM | POA: Insufficient documentation

## 2019-08-25 DIAGNOSIS — Z79899 Other long term (current) drug therapy: Secondary | ICD-10-CM | POA: Diagnosis not present

## 2019-08-25 DIAGNOSIS — E559 Vitamin D deficiency, unspecified: Secondary | ICD-10-CM | POA: Insufficient documentation

## 2019-08-25 DIAGNOSIS — Z981 Arthrodesis status: Secondary | ICD-10-CM | POA: Diagnosis not present

## 2019-08-25 DIAGNOSIS — S32020G Wedge compression fracture of second lumbar vertebra, subsequent encounter for fracture with delayed healing: Secondary | ICD-10-CM | POA: Diagnosis not present

## 2019-08-25 DIAGNOSIS — S32020A Wedge compression fracture of second lumbar vertebra, initial encounter for closed fracture: Secondary | ICD-10-CM | POA: Insufficient documentation

## 2019-08-25 DIAGNOSIS — N941 Unspecified dyspareunia: Secondary | ICD-10-CM | POA: Diagnosis not present

## 2019-08-25 DIAGNOSIS — F419 Anxiety disorder, unspecified: Secondary | ICD-10-CM | POA: Insufficient documentation

## 2019-08-25 DIAGNOSIS — Z820 Family history of epilepsy and other diseases of the nervous system: Secondary | ICD-10-CM | POA: Insufficient documentation

## 2019-08-25 DIAGNOSIS — S32020D Wedge compression fracture of second lumbar vertebra, subsequent encounter for fracture with routine healing: Secondary | ICD-10-CM | POA: Diagnosis not present

## 2019-08-25 DIAGNOSIS — G43909 Migraine, unspecified, not intractable, without status migrainosus: Secondary | ICD-10-CM | POA: Insufficient documentation

## 2019-08-25 HISTORY — PX: KYPHOPLASTY: SHX5884

## 2019-08-25 SURGERY — KYPHOPLASTY
Anesthesia: General | Site: Back

## 2019-08-25 MED ORDER — FENTANYL CITRATE (PF) 100 MCG/2ML IJ SOLN: 25.0000 ug | INTRAMUSCULAR | Status: DC | PRN

## 2019-08-25 MED ORDER — LACTATED RINGERS IV SOLN: INTRAVENOUS | Status: DC

## 2019-08-25 MED ORDER — LIDOCAINE HCL 1 % IJ SOLN
INTRAMUSCULAR | Status: DC | PRN
  Administered 2019-08-25: 10 mL

## 2019-08-25 MED ORDER — FENTANYL CITRATE (PF) 100 MCG/2ML IJ SOLN
INTRAMUSCULAR | Status: DC | PRN
  Administered 2019-08-25: 50 ug via INTRAVENOUS
  Administered 2019-08-25: 25 ug via INTRAVENOUS

## 2019-08-25 MED ORDER — METOCLOPRAMIDE HCL 10 MG PO TABS: 5.0000 mg | ORAL_TABLET | Freq: Three times a day (TID) | ORAL | Status: DC | PRN

## 2019-08-25 MED ORDER — CHLORHEXIDINE GLUCONATE 4 % EX LIQD
60.0000 mL | Freq: Once | CUTANEOUS | Status: AC
  Administered 2019-08-25: 4 via TOPICAL

## 2019-08-25 MED ORDER — ONDANSETRON HCL 4 MG/2ML IJ SOLN: 4.0000 mg | Freq: Once | INTRAMUSCULAR | Status: DC | PRN

## 2019-08-25 MED ORDER — DEXAMETHASONE SODIUM PHOSPHATE 10 MG/ML IJ SOLN
INTRAMUSCULAR | Status: DC | PRN
  Administered 2019-08-25: 10 mg via INTRAVENOUS

## 2019-08-25 MED ORDER — FAMOTIDINE 20 MG PO TABS
20.0000 mg | ORAL_TABLET | Freq: Once | ORAL | Status: AC
  Administered 2019-08-25: 13:00:00 20 mg via ORAL

## 2019-08-25 MED ORDER — PHENYLEPHRINE HCL (PRESSORS) 10 MG/ML IV SOLN
INTRAVENOUS | Status: DC | PRN
  Administered 2019-08-25 (×3): 100 ug via INTRAVENOUS

## 2019-08-25 MED ORDER — FENTANYL CITRATE (PF) 100 MCG/2ML IJ SOLN
INTRAMUSCULAR | Status: AC
  Administered 2019-08-25: 25 ug via INTRAVENOUS
  Filled 2019-08-25: qty 2

## 2019-08-25 MED ORDER — SODIUM CHLORIDE 0.9 % IV SOLN: INTRAVENOUS | Status: DC

## 2019-08-25 MED ORDER — ONDANSETRON HCL 4 MG PO TABS: 4.0000 mg | ORAL_TABLET | Freq: Four times a day (QID) | ORAL | Status: DC | PRN

## 2019-08-25 MED ORDER — MIDAZOLAM HCL 2 MG/2ML IJ SOLN
INTRAMUSCULAR | Status: DC | PRN
  Administered 2019-08-25: 2 mg via INTRAVENOUS

## 2019-08-25 MED ORDER — CEFAZOLIN SODIUM-DEXTROSE 2-4 GM/100ML-% IV SOLN
2.0000 g | INTRAVENOUS | Status: AC
  Administered 2019-08-25: 2 g via INTRAVENOUS

## 2019-08-25 MED ORDER — FENTANYL CITRATE (PF) 100 MCG/2ML IJ SOLN
INTRAMUSCULAR | Status: AC
  Filled 2019-08-25: qty 2

## 2019-08-25 MED ORDER — PROPOFOL 500 MG/50ML IV EMUL
INTRAVENOUS | Status: DC | PRN
  Administered 2019-08-25: 125 ug/kg/min via INTRAVENOUS

## 2019-08-25 MED ORDER — DEXMEDETOMIDINE HCL IN NACL 200 MCG/50ML IV SOLN
INTRAVENOUS | Status: DC | PRN
  Administered 2019-08-25 (×2): 8 ug via INTRAVENOUS

## 2019-08-25 MED ORDER — IOHEXOL 180 MG/ML  SOLN
INTRAMUSCULAR | Status: DC | PRN
  Administered 2019-08-25: 2 mL

## 2019-08-25 MED ORDER — BUPIVACAINE-EPINEPHRINE (PF) 0.5% -1:200000 IJ SOLN
INTRAMUSCULAR | Status: DC | PRN
  Administered 2019-08-25: 20 mL

## 2019-08-25 MED ORDER — METOCLOPRAMIDE HCL 5 MG/ML IJ SOLN: 5.0000 mg | Freq: Three times a day (TID) | INTRAMUSCULAR | Status: DC | PRN

## 2019-08-25 MED ORDER — PROPOFOL 500 MG/50ML IV EMUL
INTRAVENOUS | Status: AC
  Filled 2019-08-25: qty 50

## 2019-08-25 MED ORDER — LIDOCAINE HCL (PF) 2 % IJ SOLN
INTRAMUSCULAR | Status: AC
  Filled 2019-08-25: qty 5

## 2019-08-25 MED ORDER — ONDANSETRON HCL 4 MG/2ML IJ SOLN: 4.0000 mg | Freq: Four times a day (QID) | INTRAMUSCULAR | Status: DC | PRN

## 2019-08-25 MED ORDER — MIDAZOLAM HCL 2 MG/2ML IJ SOLN
INTRAMUSCULAR | Status: AC
  Filled 2019-08-25: qty 2

## 2019-08-25 MED ORDER — HYDROCODONE-ACETAMINOPHEN 5-325 MG PO TABS: 1.0000 | ORAL_TABLET | ORAL | 0 refills | Status: DC | PRN

## 2019-08-25 MED ORDER — ONDANSETRON HCL 4 MG/2ML IJ SOLN
INTRAMUSCULAR | Status: DC | PRN
  Administered 2019-08-25: 4 mg via INTRAVENOUS

## 2019-08-25 SURGICAL SUPPLY — 20 items

## 2019-08-25 NOTE — Anesthesia Preprocedure Evaluation (Signed)
Anesthesia Evaluation  Patient identified by MRN, date of birth, ID band Patient awake    Reviewed: Allergy & Precautions, H&P , NPO status , Patient's Chart, lab work & pertinent test results, reviewed documented beta blocker date and time   History of Anesthesia Complications Negative for: history of anesthetic complications  Airway Mallampati: I  TM Distance: >3 FB Neck ROM: full    Dental no notable dental hx.    Pulmonary neg pulmonary ROS,    Pulmonary exam normal breath sounds clear to auscultation       Cardiovascular Exercise Tolerance: Good negative cardio ROS Normal cardiovascular exam Rhythm:regular Rate:Normal     Neuro/Psych negative neurological ROS  negative psych ROS   GI/Hepatic negative GI ROS, Neg liver ROS,   Endo/Other  negative endocrine ROS  Renal/GU negative Renal ROS  negative genitourinary   Musculoskeletal   Abdominal   Peds  Hematology negative hematology ROS (+)   Anesthesia Other Findings Past Medical History: No date: Anxiety No date: Depression No date: Dyspareunia in female No date: Insomnia No date: Vitamin D deficiency   Reproductive/Obstetrics negative OB ROS                             Anesthesia Physical Anesthesia Plan  ASA: I  Anesthesia Plan: General   Post-op Pain Management:    Induction: Intravenous  PONV Risk Score and Plan: 3 and Propofol infusion, TIVA and Midazolam  Airway Management Planned: Natural Airway and Simple Face Mask  Additional Equipment:   Intra-op Plan:   Post-operative Plan:   Informed Consent: I have reviewed the patients History and Physical, chart, labs and discussed the procedure including the risks, benefits and alternatives for the proposed anesthesia with the patient or authorized representative who has indicated his/her understanding and acceptance.     Dental Advisory Given  Plan Discussed  with: Anesthesiologist, CRNA and Surgeon  Anesthesia Plan Comments:         Anesthesia Quick Evaluation

## 2019-08-25 NOTE — Transfer of Care (Signed)
Immediate Anesthesia Transfer of Care Note  Patient: Linda Ali  Procedure(s) Performed: Procedure(s): L2 KYPHOPLASTY & BIOPSY (N/A)  Patient Location: PACU  Anesthesia Type:General  Level of Consciousness: sedated  Airway & Oxygen Therapy: Patient Spontanous Breathing and Patient connected to face mask oxygen  Post-op Assessment: Report given to RN and Post -op Vital signs reviewed and stable  Post vital signs: Reviewed and stable  Last Vitals:  Vitals:   08/25/19 1318 08/25/19 1547  BP: 134/88 (!) 88/64  Pulse: 94 90  Resp: 18 16  Temp: 36.4 C (!) 36.3 C  SpO2: 94% 100%    Complications: No apparent anesthesia complications

## 2019-08-25 NOTE — Discharge Instructions (Addendum)
Remove Band-Aids in 2 days then okay to shower.  Try to avoid lifting anything over 5 pounds for 2 weeks.  Pain medicine as directed. Call office if you are having increasing pain.   AMBULATORY SURGERY  DISCHARGE INSTRUCTIONS   1) The drugs that you were given will stay in your system until tomorrow so for the next 24 hours you should not:  A) Drive an automobile B) Make any legal decisions C) Drink any alcoholic beverage   2) You may resume regular meals tomorrow.  Today it is better to start with liquids and gradually work up to solid foods.  You may eat anything you prefer, but it is better to start with liquids, then soup and crackers, and gradually work up to solid foods.   3) Please notify your doctor immediately if you have any unusual bleeding, trouble breathing, redness and pain at the surgery site, drainage, fever, or pain not relieved by medication.    4) Additional Instructions:        Please contact your physician with any problems or Same Day Surgery at [phone], Monday through Friday 6 am to 4 pm, or [place] at [facility] number at [phone].

## 2019-08-25 NOTE — Op Note (Signed)
Date August 25, 2019  time 3:50 PM   PATIENT:  Linda Ali   PRE-OPERATIVE DIAGNOSIS:  closed wedge compression fracture of L2   POST-OPERATIVE DIAGNOSIS:  closed wedge compression fracture of L2   PROCEDURE:  Procedure(s): KYPHOPLASTY L2  SURGEON: Laurene Footman, MD   ASSISTANTS: None   ANESTHESIA:   local and MAC   EBL:  No intake/output data recorded.   BLOOD ADMINISTERED:none   DRAINS: none    LOCAL MEDICATIONS USED:  MARCAINE    and XYLOCAINE    SPECIMEN:   L2 vertebral body biopsy   DISPOSITION OF SPECIMEN:  Pathology   COUNTS:  YES   TOURNIQUET:  * No tourniquets in log *   IMPLANTS: Bone cement   DICTATION: .Dragon Dictation  patient was brought to the operating room and after adequate anesthesia was obtained the patient was placed prone.  C arm was brought in in good visualization of the affected level obtained on both AP and lateral projections.  After patient identification and timeout procedures were completed, local anesthetic was infiltrated with 10 cc 1% Xylocaine infiltrated subcutaneously.  This is done the area on the each side of the planned approach.  The back was then prepped and draped in the usual sterile manner and repeat timeout procedure carried out.  A spinal needle was brought down to the pedicle on the each side of  L2 and a 50-50 mix of 1% Xylocaine half percent Sensorcaine with epinephrine total of 20 cc injected.  After allowing this to set a small incision was made and the trocar was advanced into the vertebral body in an extrapedicular fashion.  Biopsy was obtained Drilling was carried out balloon inserted with inflation to  2-1/2 cc on each side.  When the cement was appropriate consistency 6-1/2 cc were injected into the vertebral body without extravasation, good fill superior to inferior endplates and from right to left sides along the inferior endplate.  After the cement had set the trochar was removed and permanent C-arm views obtained.   The wound was closed with Dermabond followed by Band-Aid   PLAN OF CARE: Discharge to home after PACU   PATIENT DISPOSITION:  PACU - hemodynamically stable.

## 2019-08-25 NOTE — H&P (Signed)
Reviewed paper H+P, will be scanned into chart. No changes noted.  

## 2019-08-26 NOTE — Anesthesia Postprocedure Evaluation (Signed)
Anesthesia Post Note  Patient: Linda Ali  Procedure(s) Performed: L2 KYPHOPLASTY & BIOPSY (N/A Back)  Patient location during evaluation: PACU Anesthesia Type: General Level of consciousness: awake and alert Pain management: pain level controlled Vital Signs Assessment: post-procedure vital signs reviewed and stable Respiratory status: spontaneous breathing, nonlabored ventilation, respiratory function stable and patient connected to nasal cannula oxygen Cardiovascular status: blood pressure returned to baseline and stable Postop Assessment: no apparent nausea or vomiting Anesthetic complications: no     Last Vitals:  Vitals:   08/25/19 1633 08/25/19 1650  BP: 110/72 116/78  Pulse: 75 71  Resp: 11 18  Temp: 36.9 C (!) 36.3 C  SpO2: 100%     Last Pain:  Vitals:   08/26/19 0854  TempSrc:   PainSc: 0-No pain                 Lenard Simmer

## 2019-08-29 LAB — SURGICAL PATHOLOGY

## 2019-08-30 ENCOUNTER — Other Ambulatory Visit: Payer: Self-pay | Admitting: Certified Nurse Midwife

## 2019-08-30 ENCOUNTER — Encounter: Payer: BC Managed Care – PPO | Admitting: Certified Nurse Midwife

## 2019-08-30 DIAGNOSIS — G4700 Insomnia, unspecified: Secondary | ICD-10-CM

## 2019-09-08 DIAGNOSIS — S32020A Wedge compression fracture of second lumbar vertebra, initial encounter for closed fracture: Secondary | ICD-10-CM | POA: Diagnosis not present

## 2019-09-12 DIAGNOSIS — M810 Age-related osteoporosis without current pathological fracture: Secondary | ICD-10-CM | POA: Diagnosis not present

## 2019-09-13 ENCOUNTER — Encounter: Payer: Self-pay | Admitting: Certified Nurse Midwife

## 2019-09-13 ENCOUNTER — Ambulatory Visit: Payer: BC Managed Care – PPO | Admitting: Certified Nurse Midwife

## 2019-09-13 ENCOUNTER — Other Ambulatory Visit: Payer: Self-pay

## 2019-09-13 VITALS — BP 106/75 | HR 110 | Ht 64.0 in | Wt 129.0 lb

## 2019-09-13 DIAGNOSIS — Z7689 Persons encountering health services in other specified circumstances: Secondary | ICD-10-CM | POA: Diagnosis not present

## 2019-09-13 DIAGNOSIS — G4700 Insomnia, unspecified: Secondary | ICD-10-CM | POA: Diagnosis not present

## 2019-09-13 MED ORDER — CYANOCOBALAMIN 1000 MCG/ML IJ SOLN: 1000.0000 ug | INTRAMUSCULAR | 11 refills | Status: DC

## 2019-09-13 MED ORDER — TRAZODONE HCL 150 MG PO TABS: 150.0000 mg | ORAL_TABLET | Freq: Every day | ORAL | 11 refills | Status: DC

## 2019-09-13 NOTE — Progress Notes (Signed)
SUBJECTIVE:  54 y.o. here for follow-up weight loss visit, previously seen 8 weeks ago. Denies any concerns and feels like medication was working but has been unable to exercise due to recent procedure. She denies any negative side effects. Discussed recommended wt loss of 5% in 3 months and at our last visit we discussed discontinuation if not met. She verbalizes and agrees to plan.   OBJECTIVE:  BP 106/75   Pulse (!) 110   Ht _0  (1.626 m)   Wt 129 lb (58.5 kg)   BMI 22.14 kg/m   Body mass index is 22.14 kg/m. Patient appears well. ASSESSMENT:  Weight loss goal not attained, medication not working.  PLAN:  To continue with current medications. B12 1066mg/ml injection given RTC  PRN Pt encourged to continue diet and exercise to lose the last few pounds but given that she has not met goal 5% in 5 months recommend discontinuation of medications. She verbalizes and agrees to  Plan . Refills placed for trazodone and vitamin B 12.   I attest more than 50% of visit spent reviewing history discussing medication use for weight loss , guidelines and duration of use, risks vs benefits and developing a plan of care. Face to face time 10 min  APhilip Aspen CNM

## 2019-10-03 ENCOUNTER — Other Ambulatory Visit: Payer: Self-pay

## 2019-10-03 MED ORDER — VITAMIN D (ERGOCALCIFEROL) 1.25 MG (50000 UNIT) PO CAPS: 50000.0000 [IU] | ORAL_CAPSULE | ORAL | 2 refills | Status: DC

## 2019-12-15 ENCOUNTER — Other Ambulatory Visit: Payer: Self-pay | Admitting: Orthopedic Surgery

## 2019-12-15 DIAGNOSIS — M5414 Radiculopathy, thoracic region: Secondary | ICD-10-CM

## 2019-12-30 ENCOUNTER — Ambulatory Visit
Admission: RE | Admit: 2019-12-30 | Discharge: 2019-12-30 | Disposition: A | Discharge status: Home / Self Care | Payer: No Typology Code available for payment source | Source: Ambulatory Visit | Attending: Orthopedic Surgery | Admitting: Orthopedic Surgery

## 2019-12-30 ENCOUNTER — Other Ambulatory Visit: Payer: Self-pay

## 2019-12-30 DIAGNOSIS — M5414 Radiculopathy, thoracic region: Secondary | ICD-10-CM | POA: Diagnosis not present

## 2020-03-09 ENCOUNTER — Telehealth: Payer: Self-pay | Admitting: *Deleted

## 2020-03-09 NOTE — Telephone Encounter (Signed)
Patient called states she tested positive COVID with a rapid on 03/06/20 at her facility; she was retested with PCR at [facility]; she is having headache, chills sore throat congestion and fatigue; she would like to obtain the COVID infusion;tier of symptoms reviewed; Instructions given to pt: Self-Isolation, Ending Isolation, ED/UC .  remain in self-quarantine until they meet the "Non-Test Criteria for Ending Self-Isolation". Non-Test Criteria for Ending Self-Isolation All persons with fever and respiratory symptoms should isolate themselves until ALL conditions listed below are met: - at least 10 days since symptoms onset - AND 3 consecutive days fever free without antipyretics (acetaminophen [Tylenol] or ibuprofen [Advil]) - AND improvement in respiratory symptoms . If the patient develops respiratory issues/distress, seek medical care in the Emergency Department, call 911, reports symptoms and report COVID-19 positive test. Patient Instructions . utilize over-the-counter medications for fever (ibuprofen and/or Tylenol) and cough (cough medicine and/or sore throat lozenges). . wear a mask around people and follow good infection prevention techniques. . Patient to should only leave home to seek medical care. . send family for food, prescriptions or medicines; or use delivery service.  . If the patient must leave the home, they must wear a mask in public. Marland Kitchen limit contact with immediate family members or caregivers in the home, and use mask, social distancing, and handwashing to decrease risk to patients. o Please continue good preventive care measures, including frequent hand washing, avoid touching your face, cover coughs/sneezes with tissue or into elbow, stay out of crowds and keep a 6-foot distance from others.   . patient and family to clean hard surfaces touched by patient frequently with household cleaning products.  The pt verbalized understanding; she sees Fenton Malling,  [facility]; her best contact number is [phone]; she would like to discussion getting an order for an infusion; will route to office for notification.

## 2020-03-12 ENCOUNTER — Telehealth: Payer: Self-pay

## 2020-03-12 NOTE — Telephone Encounter (Signed)
Patient advised as directed below. 

## 2020-03-12 NOTE — Telephone Encounter (Signed)
Unfortunately she does not meet criteria for the infusion therapy. Below are inclusion criteria:  ? Older age (for example age ?55 years of age)   ? Obesity or being overweight (for example, adults with BMI >25 kg/m2, or if age 104-17, have BMI ?85th percentile for their age and gender based on CDC growth charts, [email or web address].[email or web address])   ? Pregnancy   ? Chronic kidney disease   ? Diabetes   ? Immunosuppressive disease or immunosuppressive treatment   ? Cardiovascular disease (including congenital heart disease) or hypertension   ? Chronic lung diseases (for example, chronic obstructive pulmonary disease, asthma [moderate-to-severe], interstitial lung disease, cystic fibrosis and pulmonary hypertension)   ? Sickle cell disease   ? Neurodevelopmental disorders (for example, cerebral palsy) or other conditions that confer medical complexity (for example, genetic or metabolic syndromes and severe congenital anomalies)   ? Having a Sales executive dependence (for example, tracheostomy, gastrostomy, or positive pressure ventilation (not related to COVID-19))

## 2020-03-12 NOTE — Telephone Encounter (Signed)
Copied from CRM [id number]. Topic: General - Inquiry >> Mar 09, 2020 11:04 AM Adrian Prince D wrote: Reason for CRM: Patient tested positive for covid and she would like to know if the Doctor could call her something in, she really doesn't feel well. She can be reached at [phone]. Please advise.

## 2020-03-12 NOTE — Telephone Encounter (Signed)
She would need a virtual appointment to discuss her symptoms. Covid is viral so there is nothing we treat with other than symptomatic treatments. Below are OTC recommendations.  Can take to lessen severity: Vit C 500mg  twice daily Quercertin 250-500mg  twice daily Zinc 75-100mg  daily Melatonin 3-6 mg at bedtime Vit D3 1000-2000 IU daily Aspirin 81 mg daily with food Optional: Famotidine 20mg  daily Also can add tylenol/ibuprofen as needed for fevers and body aches May add Mucinex or Mucinex DM as needed for cough/congestion

## 2020-03-14 ENCOUNTER — Telehealth (INDEPENDENT_AMBULATORY_CARE_PROVIDER_SITE_OTHER): Payer: No Typology Code available for payment source | Admitting: Physician Assistant

## 2020-03-14 ENCOUNTER — Telehealth: Payer: Self-pay

## 2020-03-14 DIAGNOSIS — U071 COVID-19: Secondary | ICD-10-CM

## 2020-03-14 MED ORDER — BENZONATATE 200 MG PO CAPS: 200.0000 mg | ORAL_CAPSULE | Freq: Three times a day (TID) | ORAL | 0 refills | Status: DC | PRN

## 2020-03-14 NOTE — Telephone Encounter (Signed)
Copied from CRM [id number]. Topic: Appointment Scheduling - Scheduling Inquiry for Clinic >> Mar 14, 2020  8:08 AM Elliot Gault wrote: Reason for CRM: FYI- Patient cancelled 10:40am appointment scheduled for  today. Patient states  she is doing everything she is suppose to do and if she needs [place] she will call.

## 2020-03-18 ENCOUNTER — Encounter: Payer: Self-pay | Admitting: Physician Assistant

## 2020-03-18 NOTE — Progress Notes (Signed)
Virtual telephone visit    Virtual Visit via Telephone Note   This visit type was conducted due to national recommendations for restrictions regarding the COVID-19 Pandemic (e.g. social distancing) in an effort to limit this patient's exposure and mitigate transmission in our community. Due to her co-morbid illnesses, this patient is at least at moderate risk for complications without adequate follow up. This format is felt to be most appropriate for this patient at this time. The patient did not have access to video technology or had technical difficulties with video requiring transitioning to audio format only (telephone). Physical exam was limited to content and character of the telephone converstion.    Patient location: Home Provider location: [facility]  I discussed the limitations of evaluation and management by telemedicine and the availability of in person appointments. The patient expressed understanding and agreed to proceed.   Visit Date: 03/14/2020  Today's healthcare provider: Margaretann Loveless, PA-C   No chief complaint on file.  Subjective    HPI   Linda Ali is a 54 yr old female that presents today via telephone visit to f/u a recent covid 19 diagnosis. She reports she is over doing ok. She does have fatigue, sinus congestion, headache and cough. She denies any SOB. States she does feel better today than she has over the last few days.     Patient Active Problem List   Diagnosis Date Noted  . Vitamin D deficiency 03/11/2016  . Menopause 03/11/2016  . Anxiety 12/06/2014  . Cannot sleep 12/06/2014   Past Medical History:  Diagnosis Date  . Anxiety   . Depression   . Dyspareunia in female   . Insomnia   . Vitamin D deficiency       Medications: Outpatient Medications Prior to Visit  Medication Sig  . acetaminophen (TYLENOL) 500 MG tablet Take 1,000 mg by mouth every 8 (eight) hours as needed for moderate pain.  . cyanocobalamin (,VITAMIN B-12,)  1000 MCG/ML injection Inject 1 mL (1,000 mcg total) into the muscle every 30 (thirty) days.  Marland Kitchen HYDROcodone-acetaminophen (NORCO/VICODIN) 5-325 MG tablet Take 1 tablet by mouth every 4 (four) hours as needed for moderate pain. (Patient not taking: Reported on 09/13/2019)  . MULTIPLE VITAMIN PO Take 1 tablet by mouth daily.  . SUMAtriptan (IMITREX) 50 MG tablet TAKE 1 TABLET BY MOUTH EVERY 2 HRS AS NEEDED FOR MIGRAINE. REPEAT IN 2 HRS IF HEADACHE PERSISTS OR RECURS (Patient taking differently: Take 50 mg by mouth every 2 (two) hours as needed for migraine. )  . traZODone (DESYREL) 150 MG tablet Take 1 tablet (150 mg total) by mouth at bedtime.  . Vitamin D, Ergocalciferol, (DRISDOL) 1.25 MG (50000 UNIT) CAPS capsule Take 1 capsule (50,000 Units total) by mouth 2 (two) times a week.   No facility-administered medications prior to visit.    Review of Systems  Constitutional: Positive for fatigue. Negative for appetite change, chills and fever.  HENT: Positive for congestion, postnasal drip and sinus pressure. Negative for ear pain and sore throat.   Respiratory: Positive for cough. Negative for shortness of breath and wheezing.   Cardiovascular: Negative for chest pain, palpitations and leg swelling.    Last CBC Lab Results  Component Value Date   WBC WILL FOLLOW 03/06/2016   HGB WILL FOLLOW 03/06/2016   HCT WILL FOLLOW 03/06/2016   MCV WILL FOLLOW 03/06/2016   MCH WILL FOLLOW 03/06/2016   RDW WILL FOLLOW 03/06/2016   PLT WILL FOLLOW 03/06/2016  Last metabolic panel Lab Results  Component Value Date   GLUCOSE 78 04/29/2018   NA 138 04/29/2018   K 4.2 04/29/2018   CL 99 04/29/2018   CO2 22 04/29/2018   BUN 13 04/29/2018   CREATININE 0.70 04/29/2018   GFRNONAA 99 04/29/2018   GFRAA 114 04/29/2018   CALCIUM 9.4 04/29/2018   PROT 7.0 04/29/2018   ALBUMIN 4.6 04/29/2018   LABGLOB 2.4 04/29/2018   AGRATIO 1.9 04/29/2018   BILITOT 0.3 04/29/2018   ALKPHOS 64 04/29/2018   AST 24  04/29/2018   ALT 47 (H) 04/29/2018      Objective    There were no vitals taken for this visit. BP Readings from Last 3 Encounters:  09/13/19 106/75  08/25/19 116/78  07/22/19 111/75   Wt Readings from Last 3 Encounters:  09/13/19 129 lb (58.5 kg)  08/25/19 127 lb 13.9 oz (58 kg)  08/23/19 130 lb (59 kg)        Assessment & Plan     1. COVID-19 Continue symptomatic medication of choice. Push fluids. Rest. Continue isolation for total 10 days. Tesslon perles will be sent for cough. Call if worsening.  - benzonatate (TESSALON) 200 MG capsule; Take 1 capsule (200 mg total) by mouth 3 (three) times daily as needed.  Dispense: 30 capsule; Refill: 0   No follow-ups on file.    I discussed the assessment and treatment plan with the patient. The patient was provided an opportunity to ask questions and all were answered. The patient agreed with the plan and demonstrated an understanding of the instructions.   The patient was advised to call back or seek an in-person evaluation if the symptoms worsen or if the condition fails to improve as anticipated.  I provided 8 minutes of non-face-to-face time during this encounter.  Delmer Islam, PA-C, have reviewed all documentation for this visit. The documentation on 03/18/20 for the exam, diagnosis, procedures, and orders are all accurate and complete.  Reine Just [facility] [phone] (phone) [phone] (fax)  [facility]

## 2020-05-29 ENCOUNTER — Other Ambulatory Visit: Payer: Self-pay | Admitting: Certified Nurse Midwife

## 2020-05-29 DIAGNOSIS — Z1231 Encounter for screening mammogram for malignant neoplasm of breast: Secondary | ICD-10-CM

## 2020-05-30 ENCOUNTER — Ambulatory Visit
Admission: RE | Admit: 2020-05-30 | Discharge: 2020-05-30 | Disposition: A | Discharge status: Home / Self Care | Payer: No Typology Code available for payment source | Source: Ambulatory Visit | Attending: Certified Nurse Midwife | Admitting: Certified Nurse Midwife

## 2020-05-30 ENCOUNTER — Other Ambulatory Visit: Payer: Self-pay

## 2020-05-30 ENCOUNTER — Ambulatory Visit (INDEPENDENT_AMBULATORY_CARE_PROVIDER_SITE_OTHER): Payer: No Typology Code available for payment source | Admitting: Certified Nurse Midwife

## 2020-05-30 ENCOUNTER — Telehealth: Payer: Self-pay

## 2020-05-30 VITALS — BP 103/73 | HR 83 | Ht 65.0 in | Wt 133.7 lb

## 2020-05-30 DIAGNOSIS — R300 Dysuria: Secondary | ICD-10-CM

## 2020-05-30 DIAGNOSIS — Z1231 Encounter for screening mammogram for malignant neoplasm of breast: Secondary | ICD-10-CM | POA: Insufficient documentation

## 2020-05-30 LAB — POCT URINALYSIS DIPSTICK
Bilirubin, UA: NEGATIVE
Blood, UA: NEGATIVE
Glucose, UA: NEGATIVE
Ketones, UA: NEGATIVE
Leukocytes, UA: NEGATIVE
Nitrite, UA: NEGATIVE
Protein, UA: NEGATIVE
Spec Grav, UA: 1.01 (ref 1.010–1.025)
Urobilinogen, UA: 0.2 E.U./dL
pH, UA: 6 (ref 5.0–8.0)

## 2020-05-30 NOTE — Telephone Encounter (Signed)
Pt called in and stated that she has uti symptoms, the pt is coming in and doing a urine drop off. I made the pt an same day drop off appt with the nurse. I called back to [facility] and wanted to make sure that was okay. Pattricia Boss said that its up to the pt but the pt needs to make an annual appt with her. I informed the pt what Pattricia Boss stated and we made the pt a annual appt.

## 2020-05-30 NOTE — Progress Notes (Signed)
Pt present for urine drop off. Pt c/o burning,itching, frequency, urgency since Monday. Culture neg. Informed pt urine c&s will be sent and results take 2-3 days. Pt upset. Informed her to try taking Azo otc. She verbalized understanding.  Med, allergies, pharmacy were reviewed.

## 2020-06-01 LAB — URINE CULTURE: Organism ID, Bacteria: NO GROWTH

## 2020-06-29 ENCOUNTER — Telehealth: Payer: Self-pay

## 2020-06-29 NOTE — Telephone Encounter (Signed)
mychart message sent to patient re: no visitors 

## 2020-07-03 ENCOUNTER — Encounter: Payer: No Typology Code available for payment source | Admitting: Certified Nurse Midwife

## 2020-07-16 ENCOUNTER — Encounter: Payer: No Typology Code available for payment source | Admitting: Certified Nurse Midwife

## 2020-07-30 ENCOUNTER — Other Ambulatory Visit: Payer: Self-pay | Admitting: Certified Nurse Midwife

## 2020-07-30 DIAGNOSIS — G4700 Insomnia, unspecified: Secondary | ICD-10-CM

## 2020-09-19 ENCOUNTER — Other Ambulatory Visit: Payer: Self-pay | Admitting: Certified Nurse Midwife

## 2020-09-21 NOTE — Telephone Encounter (Signed)
Please advise on refill.

## 2020-10-12 ENCOUNTER — Other Ambulatory Visit: Payer: Self-pay | Admitting: Certified Nurse Midwife

## 2021-01-28 ENCOUNTER — Other Ambulatory Visit: Payer: Self-pay

## 2021-01-28 ENCOUNTER — Encounter: Payer: Self-pay | Admitting: Certified Nurse Midwife

## 2021-01-28 ENCOUNTER — Ambulatory Visit (INDEPENDENT_AMBULATORY_CARE_PROVIDER_SITE_OTHER): Payer: No Typology Code available for payment source | Admitting: Certified Nurse Midwife

## 2021-01-28 VITALS — BP 110/77 | HR 75 | Ht 65.0 in | Wt 130.6 lb

## 2021-01-28 DIAGNOSIS — Z1231 Encounter for screening mammogram for malignant neoplasm of breast: Secondary | ICD-10-CM | POA: Diagnosis not present

## 2021-01-28 DIAGNOSIS — Z1211 Encounter for screening for malignant neoplasm of colon: Secondary | ICD-10-CM

## 2021-01-28 DIAGNOSIS — Z114 Encounter for screening for human immunodeficiency virus [HIV]: Secondary | ICD-10-CM | POA: Diagnosis not present

## 2021-01-28 DIAGNOSIS — Z1159 Encounter for screening for other viral diseases: Secondary | ICD-10-CM

## 2021-01-28 DIAGNOSIS — Z01419 Encounter for gynecological examination (general) (routine) without abnormal findings: Secondary | ICD-10-CM | POA: Diagnosis not present

## 2021-01-28 DIAGNOSIS — Z113 Encounter for screening for infections with a predominantly sexual mode of transmission: Secondary | ICD-10-CM | POA: Diagnosis not present

## 2021-01-28 MED ORDER — VITAMIN D (ERGOCALCIFEROL) 1.25 MG (50000 UNIT) PO CAPS: 50000.0000 [IU] | ORAL_CAPSULE | ORAL | 11 refills | Status: DC

## 2021-01-28 MED ORDER — CYANOCOBALAMIN 1000 MCG/ML IJ SOLN: 1000.0000 ug | INTRAMUSCULAR | 11 refills | Status: DC

## 2021-01-28 NOTE — Progress Notes (Signed)
GYNECOLOGY ANNUAL PREVENTATIVE CARE ENCOUNTER NOTE  History:     Linda Ali is a 55 y.o. G2P0 female here for a routine annual gynecologic exam.  Current complaints: none.   Denies abnormal vaginal bleeding, discharge, pelvic pain, problems with intercourse or other gynecologic concerns.     Social Relationship:single  Living:alone Work: Nurse, adult a Armed forces operational officer Exercise: walking 4-5 x [employer] use: denies   Gynecologic History No LMP recorded. Patient is postmenopausal. Contraception: post menopausal status Last Pap: 04/21/2017. Results were: normal with negative HPV Last mammogram: 06/04/20. Results were: normal  Obstetric History OB History  Gravida Para Term Preterm AB Living  2            SAB IAB Ectopic Multiple Live Births               # Outcome Date GA Lbr Len/2nd Weight Sex Delivery Anes PTL Lv  2 Gravida 1994    F CS-Unspec     1 Gravida 1992    M CS-Unspec       Past Medical History:  Diagnosis Date   Anxiety    Depression    Dyspareunia in female    Insomnia    Vitamin D deficiency     Past Surgical History:  Procedure Laterality Date   CESAREAN SECTION  [id number]   KYPHOPLASTY N/A 08/25/2019   Procedure: L2 KYPHOPLASTY & BIOPSY;  Surgeon: Kennedy Bucker, MD;  Location: [facility] ORS;  Service: Orthopedics;  Laterality: N/A;    Current Outpatient Medications on File Prior to Visit  Medication Sig Dispense Refill   acetaminophen (TYLENOL) 500 MG tablet Take 1,000 mg by mouth every 8 (eight) hours as needed for moderate pain.     cyanocobalamin (,VITAMIN B-12,) 1000 MCG/ML injection Inject 1 mL (1,000 mcg total) into the muscle every 30 (thirty) days. 10 mL 11   MULTIPLE VITAMIN PO Take 1 tablet by mouth daily.     spironolactone (ALDACTONE) 50 MG tablet Take 1 tablet by mouth daily.     traZODone (DESYREL) 150 MG tablet TAKE 1 TABLET (150 MG TOTAL) BY MOUTH AT BEDTIME. 30 tablet 4   Vitamin D, Ergocalciferol, (DRISDOL) 1.25  MG (50000 UNIT) CAPS capsule TAKE 1 CAPSULE (50,000 UNITS TOTAL) BY MOUTH 2 (TWO) TIMES A WEEK. 8 capsule 11   SUMAtriptan (IMITREX) 50 MG tablet TAKE 1 TABLET BY MOUTH EVERY 2 HRS AS NEEDED FOR MIGRAINE. REPEAT IN 2 HRS IF HEADACHE PERSISTS OR RECURS (Patient not taking: Reported on 01/28/2021) 9 tablet 1   No current facility-administered medications on file prior to visit.    No Known Allergies  Social History:  reports that she has never smoked. She has never used smokeless tobacco. She reports current alcohol use. She reports that she does not use drugs.  Family History  Problem Relation Age of Onset   Parkinson's disease Father    Lymphoma Father    Breast cancer Neg Hx    Diabetes Neg Hx     The following portions of the patient's history were reviewed and updated as appropriate: allergies, current medications, past family history, past medical history, past social history, past surgical history and problem list.  Review of Systems Pertinent items noted in HPI and remainder of comprehensive ROS otherwise negative.  Physical Exam:  There were no vitals taken for this visit. CONSTITUTIONAL: Well-developed, well-nourished female in no acute distress.  HENT:  Normocephalic, atraumatic, External right and left ear normal. Oropharynx is clear and moist  EYES: Conjunctivae and EOM are normal. Pupils are equal, round, and reactive to light. No scleral icterus.  NECK: Normal range of motion, supple, no masses.  Normal thyroid.  SKIN: Skin is warm and dry. No rash noted. Not diaphoretic. No erythema. No pallor. MUSCULOSKELETAL: Normal range of motion. No tenderness.  No cyanosis, clubbing, or edema.  2+ distal pulses. NEUROLOGIC: Alert and oriented to person, place, and time. Normal reflexes, muscle tone coordination.  PSYCHIATRIC: Normal mood and affect. Normal behavior. Normal judgment and thought content. CARDIOVASCULAR: Normal heart rate noted, regular rhythm RESPIRATORY: Clear to  auscultation bilaterally. Effort and breath sounds normal, no problems with respiration noted. BREASTS: Symmetric in size. No masses, tenderness, skin changes, nipple drainage, or lymphadenopathy bilaterally.  ABDOMEN: Soft, no distention noted.  No tenderness, rebound or guarding.  PELVIC: Normal appearing external genitalia and urethral meatus; normal appearing vaginal mucosa and cervix. Normal atrophic changes. No abnormal discharge noted.  Pap smear not indicated.  Normal uterine size, no other palpable masses, no uterine or adnexal tenderness.  .   Assessment and Plan:    1. Women's annual routine gynecological examination  Pap: not due Mammogram : ordered Labs: HIV, Hep C Refills: VitB, Vit D Referral: GI for colonoscopy Routine preventative health maintenance measures emphasized. Please refer to After Visit Summary for other counseling recommendations.      Doreene Burke, CNM [facility],  [facility]

## 2021-01-28 NOTE — Patient Instructions (Signed)
Preventive Care 56-56 Years Old, Female Preventive care refers to lifestyle choices and visits with your health care provider that can promote health and wellness. This includes: A yearly physical exam. This is also called an annual wellness visit. Regular dental and eye exams. Immunizations. Screening for certain conditions. Healthy lifestyle choices, such as: Eating a healthy diet. Getting regular exercise. Not using drugs or products that contain nicotine and tobacco. Limiting alcohol use. What can I expect for my preventive care visit? Physical exam Your health care provider will check your: Height and weight. These may be used to calculate your BMI (body mass index). BMI is a measurement that tells if you are at a healthy weight. Heart rate and blood pressure. Body temperature. Skin for abnormal spots. Counseling Your health care provider may ask you questions about your: Past medical problems. Family's medical history. Alcohol, tobacco, and drug use. Emotional well-being. Home life and relationship well-being. Sexual activity. Diet, exercise, and sleep habits. Work and work Statistician. Access to firearms. Method of birth control. Menstrual cycle. Pregnancy history. What immunizations do I need?  Vaccines are usually given at various ages, according to a schedule. Your health care provider will recommend vaccines for you based on your age, medicalhistory, and lifestyle or other factors, such as travel or where you work. What tests do I need? Blood tests Lipid and cholesterol levels. These may be checked every 5 years, or more often if you are over 56 years old. Hepatitis C test. Hepatitis B test. Screening Lung cancer screening. You may have this screening every year starting at age 30 if you have a 30-pack-year history of smoking and currently smoke or have quit within the past 15 years. Colorectal cancer screening. All adults should have this screening starting at  age 23 and continuing until age 3. Your health care provider may recommend screening at age 56 if you are at increased risk. You will have tests every 1-10 years, depending on your results and the type of screening test. Diabetes screening. This is done by checking your blood sugar (glucose) after you have not eaten for a while (fasting). You may have this done every 1-3 years. Mammogram. This may be done every 1-2 years. Talk with your health care provider about when you should start having regular mammograms. This may depend on whether you have a family history of breast cancer. BRCA-related cancer screening. This may be done if you have a family history of breast, ovarian, tubal, or peritoneal cancers. Pelvic exam and Pap test. This may be done every 3 years starting at age 56. Starting at age 54, this may be done every 5 years if you have a Pap test in combination with an HPV test. Other tests STD (sexually transmitted disease) testing, if you are at risk. Bone density scan. This is done to screen for osteoporosis. You may have this scan if you are at high risk for osteoporosis. Talk with your health care provider about your test results, treatment options,and if necessary, the need for more tests. Follow these instructions at home: Eating and drinking  Eat a diet that includes fresh fruits and vegetables, whole grains, lean protein, and low-fat dairy products. Take vitamin and mineral supplements as recommended by your health care provider. Do not drink alcohol if: Your health care provider tells you not to drink. You are pregnant, may be pregnant, or are planning to become pregnant. If you drink alcohol: Limit how much you have to 0-1 drink a day. Be aware  of how much alcohol is in your drink. In the [place] one drink equals one 12 oz bottle of beer (355 mL), one 5 oz glass of wine (148 mL), or one 1 oz glass of hard liquor (44 mL).  Lifestyle Take daily care of your teeth and  gums. Brush your teeth every morning and night with fluoride toothpaste. Floss one time each day. Stay active. Exercise for at least 30 minutes 5 or more days each week. Do not use any products that contain nicotine or tobacco, such as cigarettes, e-cigarettes, and chewing tobacco. If you need help quitting, ask your health care provider. Do not use drugs. If you are sexually active, practice safe sex. Use a condom or other form of protection to prevent STIs (sexually transmitted infections). If you do not wish to become pregnant, use a form of birth control. If you plan to become pregnant, see your health care provider for a prepregnancy visit. If told by your health care provider, take low-dose aspirin daily starting at age 29. Find healthy ways to cope with stress, such as: Meditation, yoga, or listening to music. Journaling. Talking to a trusted person. Spending time with friends and family. Safety Always wear your seat belt while driving or riding in a vehicle. Do not drive: If you have been drinking alcohol. Do not ride with someone who has been drinking. When you are tired or distracted. While texting. Wear a helmet and other protective equipment during sports activities. If you have firearms in your house, make sure you follow all gun safety procedures. What's next? Visit your health care provider once a year for an annual wellness visit. Ask your health care provider how often you should have your eyes and teeth checked. Stay up to date on all vaccines. This information is not intended to replace advice given to you by your health care provider. Make sure you discuss any questions you have with your healthcare provider. Document Revised: 03/06/2020 Document Reviewed: 02/11/2018 Elsevier Patient Education  2022 [employer].

## 2021-01-29 LAB — HIV ANTIBODY (ROUTINE TESTING W REFLEX): HIV Screen 4th Generation wRfx: NONREACTIVE

## 2021-01-29 LAB — HEPATITIS C ANTIBODY: Hep C Virus Ab: <0.1 s/co ratio (ref 0.0–0.9)

## 2021-02-07 ENCOUNTER — Telehealth: Payer: Self-pay

## 2021-02-07 NOTE — Telephone Encounter (Signed)
Called patient no answer left voicemail for a call back

## 2021-02-20 IMAGING — CR RIGHT HAND - COMPLETE 3+ VIEW
3 series · 3 of 3 positions shown · non-contrast
Comparison: None.

CLINICAL DATA: Pt states she tripped last pm injuring right hand.
Most pain 1st MCP joint area

EXAM:
RIGHT HAND - COMPLETE 3+ VIEW

[hand ap]
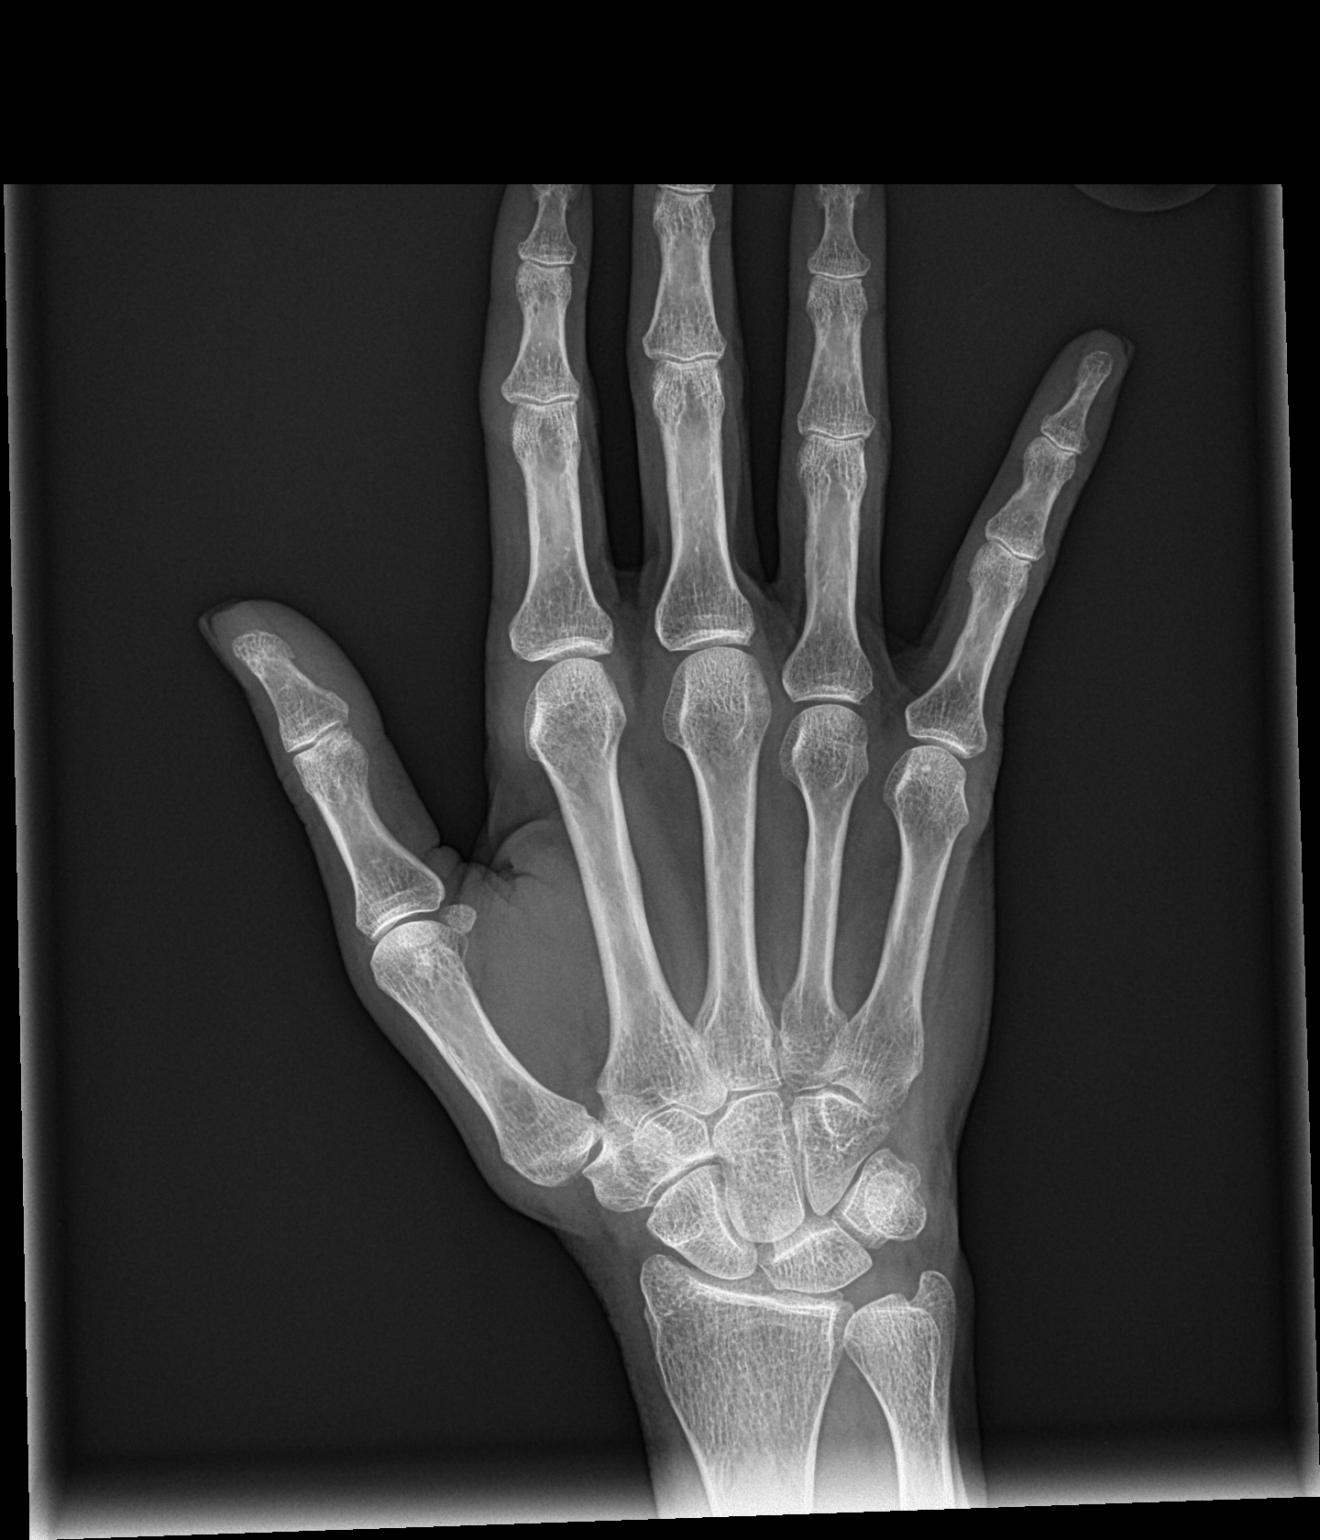

[hand obl]
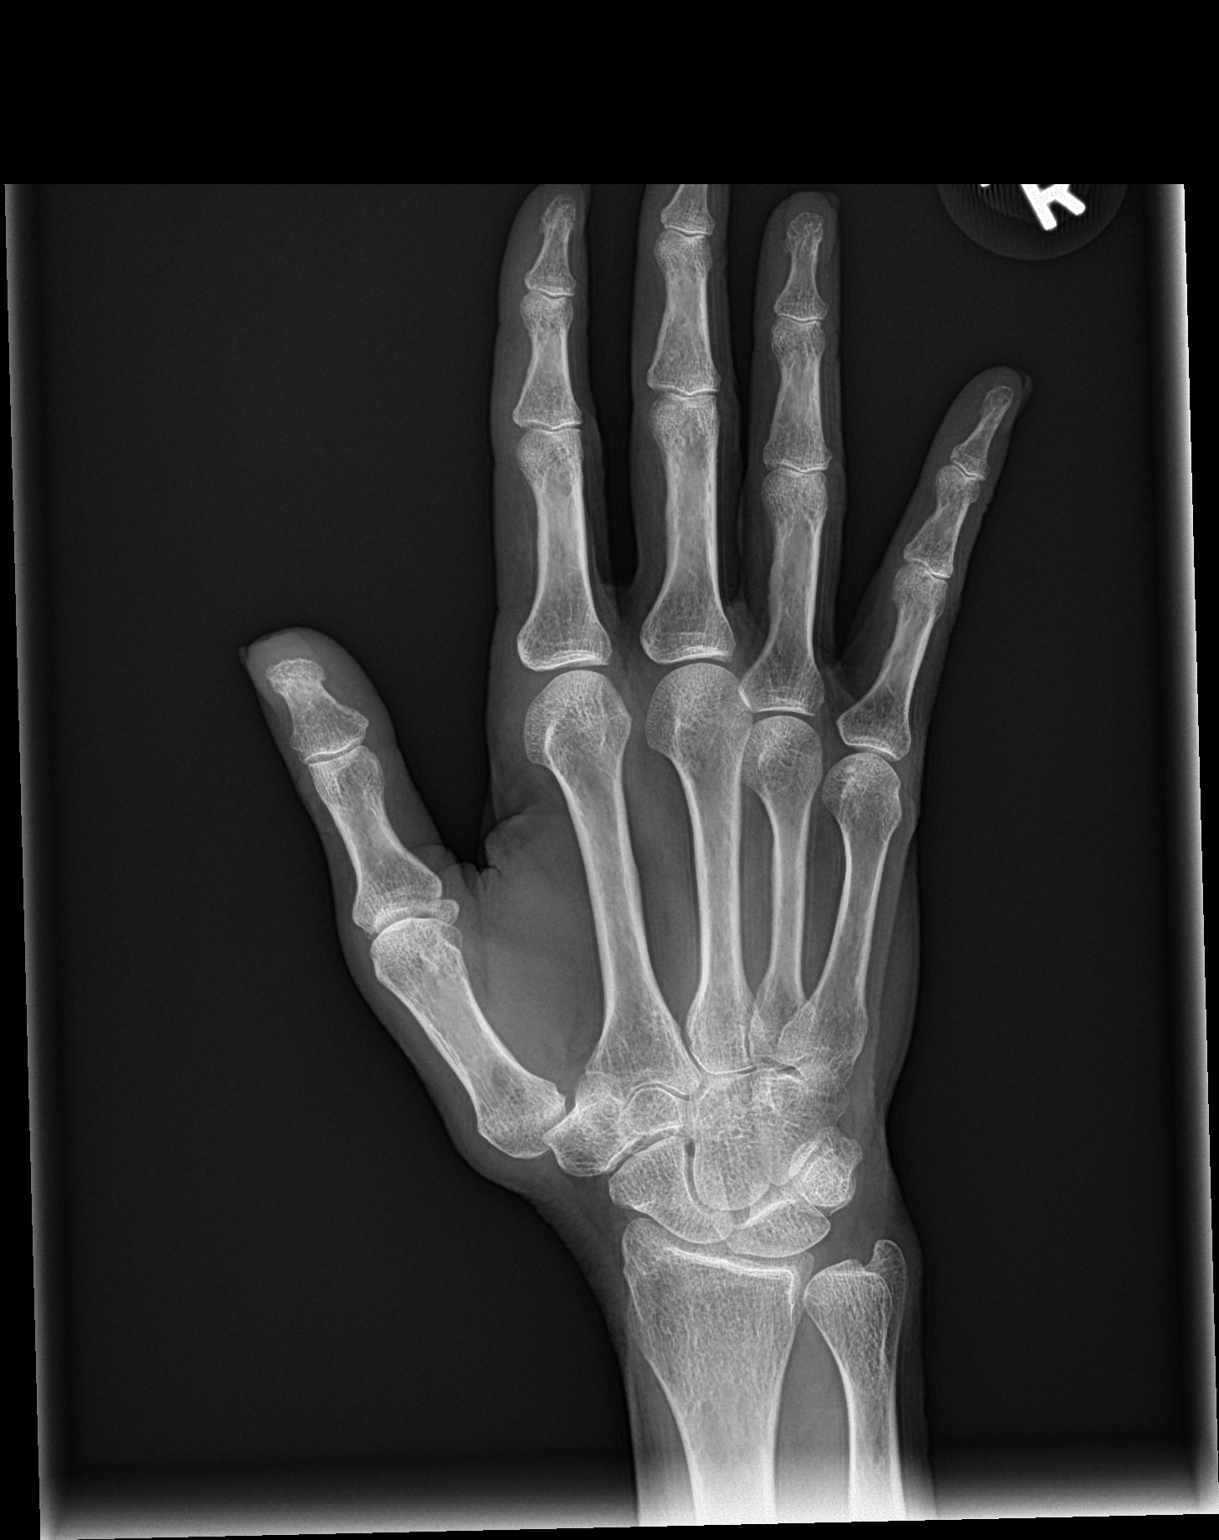

[hand lat]
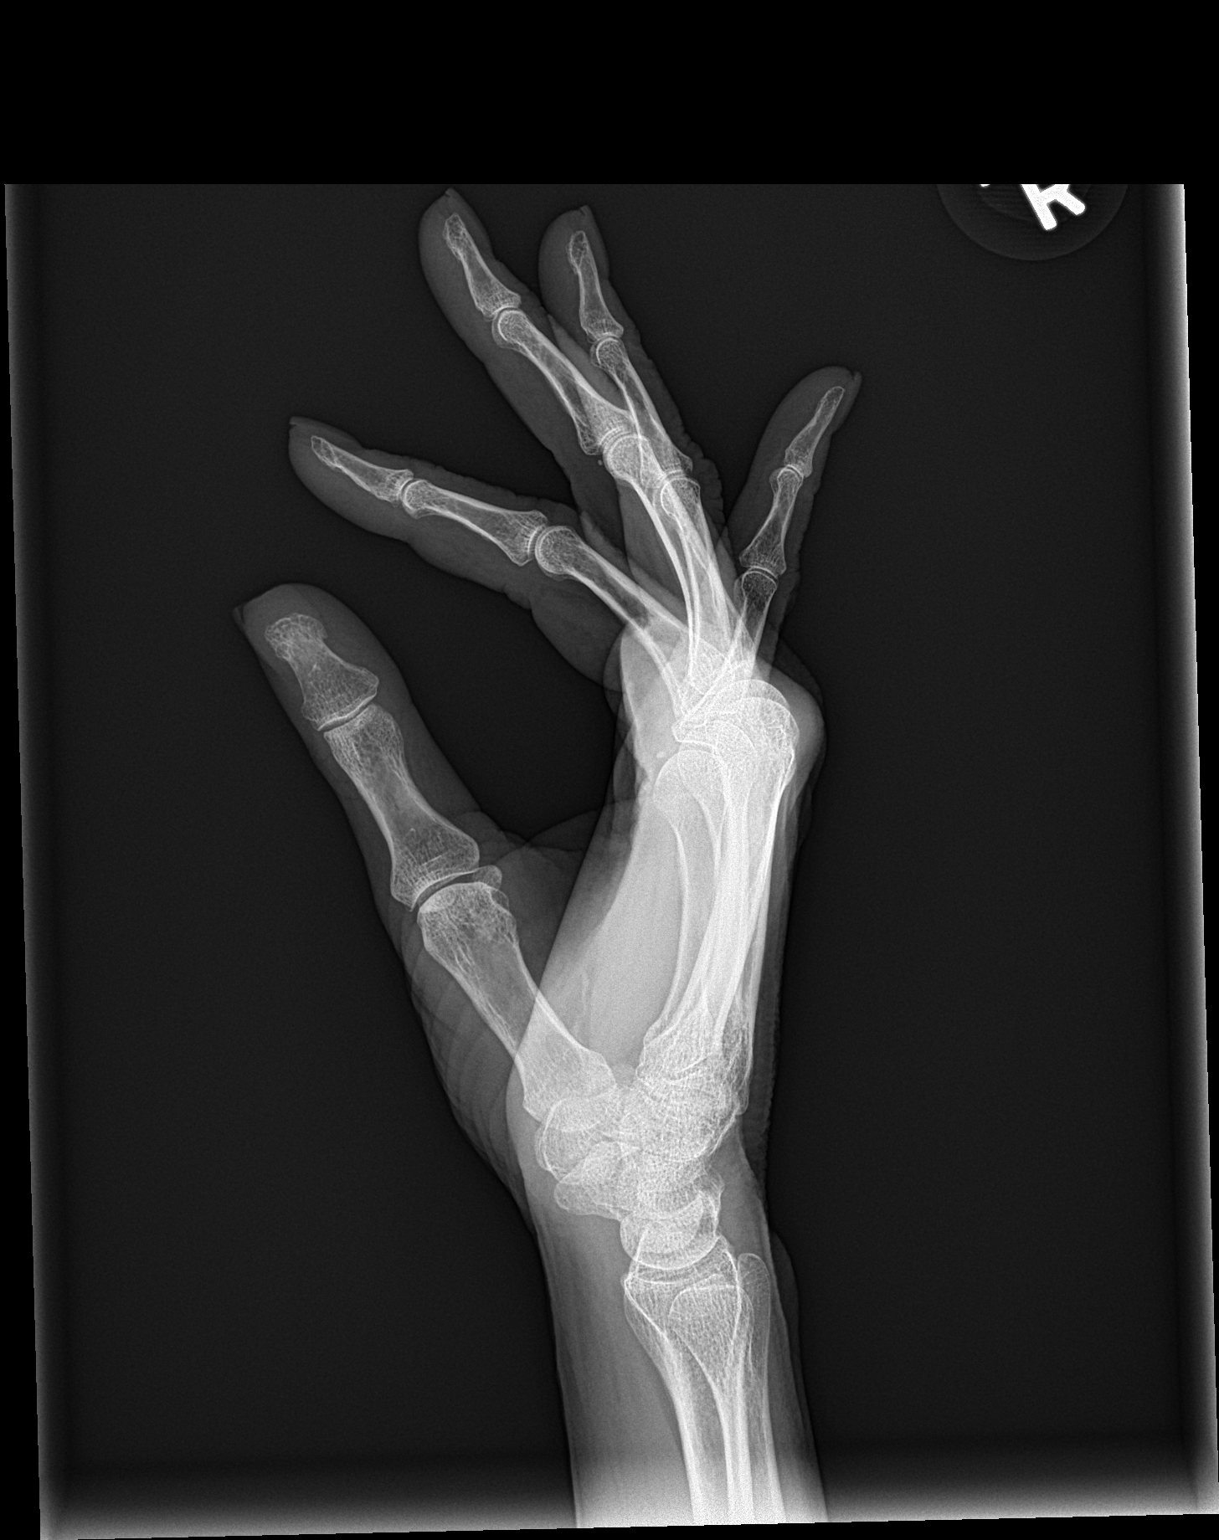

[3 of 3 positions shown; findings below may reference images not displayed]

FINDINGS: There is no evidence of fracture or dislocation. There is mild
osteoarthritis of the first CMC joint. Soft tissues are
unremarkable.
IMPRESSION: No acute osseous injury of the right hand.

## 2021-05-31 ENCOUNTER — Ambulatory Visit
Admission: RE | Admit: 2021-05-31 | Discharge: 2021-05-31 | Disposition: A | Discharge status: Home / Self Care | Payer: No Typology Code available for payment source | Source: Ambulatory Visit | Attending: Certified Nurse Midwife | Admitting: Certified Nurse Midwife

## 2021-05-31 ENCOUNTER — Other Ambulatory Visit: Payer: Self-pay

## 2021-05-31 DIAGNOSIS — Z01419 Encounter for gynecological examination (general) (routine) without abnormal findings: Secondary | ICD-10-CM | POA: Insufficient documentation

## 2021-05-31 DIAGNOSIS — Z1231 Encounter for screening mammogram for malignant neoplasm of breast: Secondary | ICD-10-CM | POA: Diagnosis present

## 2021-06-03 ENCOUNTER — Telehealth: Payer: Self-pay | Admitting: Certified Nurse Midwife

## 2021-06-03 ENCOUNTER — Other Ambulatory Visit: Payer: Self-pay | Admitting: Certified Nurse Midwife

## 2021-06-03 DIAGNOSIS — R928 Other abnormal and inconclusive findings on diagnostic imaging of breast: Secondary | ICD-10-CM

## 2021-06-03 DIAGNOSIS — N6489 Other specified disorders of breast: Secondary | ICD-10-CM

## 2021-06-03 NOTE — Telephone Encounter (Signed)
Pt called to speak with clinical staff to review mammogram results. Please Advise.

## 2021-06-04 ENCOUNTER — Encounter: Payer: Self-pay | Admitting: Certified Nurse Midwife

## 2021-06-04 ENCOUNTER — Other Ambulatory Visit: Payer: Self-pay | Admitting: Certified Nurse Midwife

## 2021-06-04 DIAGNOSIS — R928 Other abnormal and inconclusive findings on diagnostic imaging of breast: Secondary | ICD-10-CM

## 2021-06-05 NOTE — Telephone Encounter (Signed)
Pt is calling to see if she can get a call back to answer some questions that she has about her mammogram.

## 2021-06-06 ENCOUNTER — Other Ambulatory Visit: Payer: Self-pay

## 2021-06-06 ENCOUNTER — Ambulatory Visit
Admission: RE | Admit: 2021-06-06 | Discharge: 2021-06-06 | Disposition: A | Discharge status: Home / Self Care | Payer: No Typology Code available for payment source | Source: Ambulatory Visit | Attending: Certified Nurse Midwife | Admitting: Certified Nurse Midwife

## 2021-06-06 DIAGNOSIS — N6489 Other specified disorders of breast: Secondary | ICD-10-CM | POA: Insufficient documentation

## 2021-06-06 DIAGNOSIS — R928 Other abnormal and inconclusive findings on diagnostic imaging of breast: Secondary | ICD-10-CM

## 2021-06-06 NOTE — Telephone Encounter (Signed)
LM for patient

## 2021-09-18 DIAGNOSIS — M810 Age-related osteoporosis without current pathological fracture: Secondary | ICD-10-CM | POA: Diagnosis not present

## 2021-09-27 DIAGNOSIS — M810 Age-related osteoporosis without current pathological fracture: Secondary | ICD-10-CM | POA: Diagnosis not present

## 2021-09-27 DIAGNOSIS — Z8781 Personal history of (healed) traumatic fracture: Secondary | ICD-10-CM | POA: Diagnosis not present

## 2021-11-20 ENCOUNTER — Encounter: Payer: Self-pay | Admitting: Physician Assistant

## 2021-11-20 ENCOUNTER — Ambulatory Visit: Payer: BC Managed Care – PPO | Admitting: Physician Assistant

## 2021-11-20 ENCOUNTER — Ambulatory Visit: Payer: Self-pay | Admitting: *Deleted

## 2021-11-20 VITALS — BP 102/85 | HR 88 | Ht 64.0 in | Wt 138.4 lb

## 2021-11-20 DIAGNOSIS — R0789 Other chest pain: Secondary | ICD-10-CM

## 2021-11-20 DIAGNOSIS — R112 Nausea with vomiting, unspecified: Secondary | ICD-10-CM | POA: Insufficient documentation

## 2021-11-20 MED ORDER — OMEPRAZOLE 40 MG PO CPDR: 40.0000 mg | DELAYED_RELEASE_CAPSULE | Freq: Every day | ORAL | 1 refills | Status: DC

## 2021-11-20 NOTE — Telephone Encounter (Signed)
  Chief Complaint: nausea/vomiting with eating Symptoms: nausea with eating Frequency: 2-3 weeks Pertinent Negatives: Patient denies fever, pain Disposition: [] ED /[] Urgent Care (no appt availability in office) / [x] Appointment(In office/virtual)/ []  [facility]/ [] Home Care/ [] Refused Recommended Disposition /[] [facility]/ []  Follow-up with PCP Additional Notes: Patient is aware this is a problem visit- not physical

## 2021-11-20 NOTE — Progress Notes (Signed)
I,Sha'taria Tyson,acting as a Neurosurgeon for [employer], PA-C.,have documented all relevant documentation on the behalf of Linda Ferguson, PA-C,as directed by  Linda Ferguson, PA-C while in the presence of Linda Ferguson, PA-C.   Acute Office Visit  Subjective:     Patient ID: Linda Ali, female    DOB: 1965/07/11, 56 y.o.   MRN: [id number]  Cc. Nausea and vomiting  Linda Ali is a 56 y/o female who presents today for nausea and vomiting 2-3 times a week after meals. Unpredictable, started with only greens and now happens with most foods. Reports a tightness under her breast bone that starts first. It occurred once w/ activity. Denies associated headaches, changes in vision, diaphoresis, syncope, dizziness.  Initially tried TUMS without improvement.  Feels better after vomiting . Admits to stress at work.   Review of Systems  Constitutional:  Positive for chills.  Gastrointestinal:  Positive for vomiting.       Objective:  Blood pressure 102/85, pulse 88, height 5\' 4"  (1.626 m), weight 138 lb 6.4 oz (62.8 kg), SpO2 100 %.   Physical Exam Constitutional:      General: Linda Ali is awake.     Appearance: Linda Ali is well-developed.  HENT:     Head: Normocephalic.  Eyes:     Conjunctiva/sclera: Conjunctivae normal.  Cardiovascular:     Rate and Rhythm: Normal rate and regular rhythm.     Heart sounds: Normal heart sounds.  Pulmonary:     Effort: Pulmonary effort is normal.     Breath sounds: Normal breath sounds.  Abdominal:     General: Abdomen is flat.     Palpations: Abdomen is soft.     Tenderness: There is abdominal tenderness in the epigastric area and left upper quadrant. There is no guarding or rebound. Negative signs include Murphy's sign, Rovsing's sign and McBurney's sign.     Hernia: No hernia is present.  Skin:    General: Skin is warm.  Neurological:     Mental Status: Linda Ali is alert and oriented to person, place, and time.  Psychiatric:        Attention and  Perception: Attention normal.        Mood and Affect: Mood normal.        Speech: Speech normal.        Behavior: Behavior is cooperative.      Assessment & Plan:   Problem List Items Addressed This Visit       Digestive   Nausea and vomiting - Primary    Tenderness to epigastric region and LUQ.  Neg murphys sign Recommending starting omeprazole 40 mg in AM, if no improvement 2-3 weeks; advise Linda Ali call office If any vomiting w/ blood, call office or go to ED.  Will check cbc, cmp       Relevant Medications   omeprazole (PRILOSEC) 40 MG capsule   Other Relevant Orders   Comprehensive Metabolic Panel (CMET)   CBC w/Diff/Platelet   EKG 12-Lead (Completed)     Other   Chest tightness    EKG today normal sinus w/ no st elevated, t wave inversion, q wave.         Relevant Orders   EKG 12-Lead (Completed)     I, , PA-C have reviewed all documentation for this visit. The documentation on 11/20/2021 for the exam, diagnosis, procedures, and orders are all accurate and complete.  01/20/2022, PA-C [facility] [address] [place], [place], [place] Office: [phone]  Fax: [phone]

## 2021-11-20 NOTE — Telephone Encounter (Signed)
Reason for Disposition  Nausea lasts > 1 week  Answer Assessment - Initial Assessment Questions 1. NAUSEA SEVERITY: "How bad is the nausea?" (e.g., mild, moderate, severe; dehydration, weight loss)   - MILD: loss of appetite without change in eating habits   - MODERATE: decreased oral intake without significant weight loss, dehydration, or malnutrition   - SEVERE: inadequate caloric or fluid intake, significant weight loss, symptoms of dehydration     Mild- not eating as much 2. ONSET: "When did the nausea begin?"     2-3 weeks 3. VOMITING: "Any vomiting?" If Yes, ask: "How many times today?"     Not vomiting everyday- 3 times week 4. RECURRENT SYMPTOM: "Have you had nausea before?" If Yes, ask: "When was the last time?" "What happened that time?"     no 5. CAUSE: "What do you think is causing the nausea?"     Not sure 6. PREGNANCY: "Is there any chance you are pregnant?" (e.g., unprotected intercourse, missed birth control pill, broken condom)  Protocols used: Nausea-A-AH

## 2021-11-20 NOTE — Assessment & Plan Note (Signed)
Tenderness to epigastric region and LUQ.  Neg murphys sign Recommending starting omeprazole 40 mg in AM, if no improvement 2-3 weeks; advise she call office If any vomiting w/ blood, call office or go to ED.  Will check cbc, cmp

## 2021-11-20 NOTE — Assessment & Plan Note (Addendum)
EKG today normal sinus w/ no st elevated, t wave inversion, q wave.

## 2021-11-21 LAB — COMPREHENSIVE METABOLIC PANEL
ALT: 21 IU/L (ref 0–32)
AST: 21 IU/L (ref 0–40)
Albumin/Globulin Ratio: 2 (ref 1.2–2.2)
Albumin: 4.7 g/dL (ref 3.8–4.9)
Alkaline Phosphatase: 52 IU/L (ref 44–121)
BUN/Creatinine Ratio: 14 (ref 9–23)
BUN: 11 mg/dL (ref 6–24)
Bilirubin Total: <0.2 mg/dL (ref 0.0–1.2)
CO2: 21 mmol/L (ref 20–29)
Calcium: 9.1 mg/dL (ref 8.7–10.2)
Chloride: 103 mmol/L (ref 96–106)
Creatinine, Ser: 0.76 mg/dL (ref 0.57–1.00)
Globulin, Total: 2.3 g/dL (ref 1.5–4.5)
Glucose: 95 mg/dL (ref 70–99)
Potassium: 4.1 mmol/L (ref 3.5–5.2)
Sodium: 137 mmol/L (ref 134–144)
Total Protein: 7 g/dL (ref 6.0–8.5)
eGFR: 91 mL/min/{1.73_m2} (ref >59)

## 2021-11-21 LAB — CBC WITH DIFFERENTIAL/PLATELET
Basophils Absolute: 0 10*3/uL (ref 0.0–0.2)
Basos: 1 %
EOS (ABSOLUTE): 0.1 10*3/uL (ref 0.0–0.4)
Eos: 2 %
Hematocrit: 38.8 % (ref 34.0–46.6)
Hemoglobin: 12.6 g/dL (ref 11.1–15.9)
Immature Grans (Abs): 0 10*3/uL (ref 0.0–0.1)
Immature Granulocytes: 0 %
Lymphocytes Absolute: 1.8 10*3/uL (ref 0.7–3.1)
Lymphs: 34 %
MCH: 29.3 pg (ref 26.6–33.0)
MCHC: 32.5 g/dL (ref 31.5–35.7)
MCV: 90 fL (ref 79–97)
Monocytes Absolute: 0.6 10*3/uL (ref 0.1–0.9)
Monocytes: 12 %
Neutrophils Absolute: 2.7 10*3/uL (ref 1.4–7.0)
Neutrophils: 51 %
Platelets: 429 10*3/uL (ref 150–450)
RBC: 4.3 x10E6/uL (ref 3.77–5.28)
RDW: 13.1 % (ref 11.7–15.4)
WBC: 5.3 10*3/uL (ref 3.4–10.8)

## 2022-01-20 DIAGNOSIS — E119 Type 2 diabetes mellitus without complications: Secondary | ICD-10-CM | POA: Diagnosis not present

## 2022-01-20 DIAGNOSIS — Z6823 Body mass index (BMI) 23.0-23.9, adult: Secondary | ICD-10-CM | POA: Diagnosis not present

## 2022-01-20 DIAGNOSIS — E559 Vitamin D deficiency, unspecified: Secondary | ICD-10-CM | POA: Diagnosis not present

## 2022-01-20 DIAGNOSIS — E663 Overweight: Secondary | ICD-10-CM | POA: Diagnosis not present

## 2022-01-20 DIAGNOSIS — L700 Acne vulgaris: Secondary | ICD-10-CM | POA: Diagnosis not present

## 2022-01-20 DIAGNOSIS — Z713 Dietary counseling and surveillance: Secondary | ICD-10-CM | POA: Diagnosis not present

## 2022-01-20 DIAGNOSIS — E039 Hypothyroidism, unspecified: Secondary | ICD-10-CM | POA: Diagnosis not present

## 2022-01-20 DIAGNOSIS — D519 Vitamin B12 deficiency anemia, unspecified: Secondary | ICD-10-CM | POA: Diagnosis not present

## 2022-01-20 DIAGNOSIS — Z79899 Other long term (current) drug therapy: Secondary | ICD-10-CM | POA: Diagnosis not present

## 2022-01-20 DIAGNOSIS — Z7182 Exercise counseling: Secondary | ICD-10-CM | POA: Diagnosis not present

## 2022-01-29 ENCOUNTER — Encounter: Payer: No Typology Code available for payment source | Admitting: Certified Nurse Midwife

## 2022-03-21 NOTE — Progress Notes (Signed)
I,Linda Ali,acting as a Neurosurgeon for [employer], PA-C.,have documented all relevant documentation on the behalf of Linda Ferguson, PA-C,as directed by  Linda Ferguson, PA-C while in the presence of Linda Ferguson, PA-C.  Complete physical exam   Patient: Linda Ali   DOB: [birth year]   56 y.o. Female  MRN: [id number] Visit Date: 03/24/2022  Today's healthcare provider: [name], PA-C   Chief Complaint  Patient presents with   Annual Exam   Subjective    Linda Ali is a 56 y.o. female who presents today for a complete physical exam.    Past Medical History:  Diagnosis Date   Anxiety    Depression    Dyspareunia in female    Insomnia    Vitamin D deficiency    Past Surgical History:  Procedure Laterality Date   CESAREAN SECTION  [id number]   KYPHOPLASTY N/A 08/25/2019   Procedure: L2 KYPHOPLASTY & BIOPSY;  Surgeon: Kennedy Bucker, MD;  Location: [facility] ORS;  Service: Orthopedics;  Laterality: N/A;   Social History   Socioeconomic History   Marital status: Single    Spouse name: Not on file   Number of children: Not on file   Years of education: Not on file   Highest education level: Not on file  Occupational History   Not on file  Tobacco Use   Smoking status: Never   Smokeless tobacco: Never  Vaping Use   Vaping Use: Never used  Substance and Sexual Activity   Alcohol use: Yes    Alcohol/week: 0.0 standard drinks of alcohol    Comment: OCCASIONALLY   Drug use: No   Sexual activity: Yes    Birth control/protection: None  Other Topics Concern   Not on file  Social History Narrative   Not on file   Social Determinants of Health   Financial Resource Strain: Not on file  Food Insecurity: Not on file  Transportation Needs: Not on file  Physical Activity: Unknown (04/21/2017)   Exercise Vital Sign    Days of Exercise per Week: Patient refused    Minutes of Exercise per Session: Patient refused  Stress: No Stress Concern Present (04/21/2017)    [employer] of Occupational Health - Occupational Stress Questionnaire    Feeling of Stress : Not at all  Social Connections: Unknown (04/21/2017)   Social Connection and Isolation Panel [NHANES]    Frequency of Communication with Friends and Family: Patient refused    Frequency of Social Gatherings with Friends and Family: Patient refused    Attends Religious Services: Patient refused    Active Member of Clubs or Organizations: Patient refused    Attends Banker Meetings: Patient refused    Marital Status: Patient refused  Intimate Partner Violence: Unknown (04/21/2017)   Humiliation, Afraid, Rape, and Kick questionnaire    Fear of Current or Ex-Partner: Patient refused    Emotionally Abused: Patient refused    Physically Abused: Patient refused    Sexually Abused: Patient refused   Family Status  Relation Name Status   Mother  Alive   Father  Alive   Brother  Alive   Neg Hx  (Not Specified)   Family History  Problem Relation Age of Onset   Parkinson's disease Father    Lymphoma Father    Breast cancer Neg Hx    Diabetes Neg Hx    No Known Allergies  Patient Care Team: Linda Ferguson, PA-C as PCP - General (Physician Assistant)   Medications: Outpatient  Medications Prior to Visit  Medication Sig   acetaminophen (TYLENOL) 500 MG tablet Take 1,000 mg by mouth every 8 (eight) hours as needed for moderate pain.   MULTIPLE VITAMIN PO Take 1 tablet by mouth daily.   spironolactone (ALDACTONE) 50 MG tablet Take 1 tablet by mouth daily.   SUMAtriptan (IMITREX) 50 MG tablet TAKE 1 TABLET BY MOUTH EVERY 2 HRS AS NEEDED FOR MIGRAINE. REPEAT IN 2 HRS IF HEADACHE PERSISTS OR RECURS   [DISCONTINUED] cyanocobalamin (VITAMIN B12) 1000 MCG/ML injection Inject 1,000 mcg into the muscle once.   [DISCONTINUED] omeprazole (PRILOSEC) 40 MG capsule Take 1 capsule (40 mg total) by mouth daily. First thing in the morning on an empty stomach, 20-30 minutes before a meal   No  facility-administered medications prior to visit.    Review of Systems  Constitutional: Negative.   HENT: Negative.    Eyes: Negative.   Respiratory: Negative.    Cardiovascular: Negative.   Gastrointestinal: Negative.   Endocrine: Negative.   Genitourinary: Negative.   Musculoskeletal: Negative.   Skin: Negative.   Allergic/Immunologic: Negative.   Neurological: Negative.   Hematological: Negative.   Psychiatric/Behavioral: Negative.      Objective    BP 104/77 (BP Location: Left Arm, Patient Position: Sitting, Cuff Size: Normal)   Pulse 86   Resp 14   Ht 5\' 4"  (1.626 m)   Wt 130 lb (59 kg)   SpO2 100%   BMI 22.31 kg/m  Blood pressure 104/77, pulse 86, resp. rate 14, height 5\' 4"  (1.626 m), weight 130 lb (59 kg), SpO2 100 %.    Physical Exam Constitutional:      General: She is awake.     Appearance: She is well-developed. She is not ill-appearing.  HENT:     Head: Normocephalic.     Right Ear: Tympanic membrane normal.     Left Ear: Tympanic membrane normal.     Nose: Nose normal. No congestion or rhinorrhea.     Mouth/Throat:     Pharynx: No oropharyngeal exudate or posterior oropharyngeal erythema.  Eyes:     Conjunctiva/sclera: Conjunctivae normal.     Pupils: Pupils are equal, round, and reactive to light.  Neck:     Thyroid: No thyroid mass or thyromegaly.  Cardiovascular:     Rate and Rhythm: Normal rate and regular rhythm.     Heart sounds: Normal heart sounds.  Pulmonary:     Effort: Pulmonary effort is normal.     Breath sounds: Normal breath sounds.  Abdominal:     Palpations: Abdomen is soft.     Tenderness: There is no abdominal tenderness.  Musculoskeletal:     Right lower leg: No swelling. No edema.     Left lower leg: No swelling. No edema.  Lymphadenopathy:     Cervical: No cervical adenopathy.  Skin:    General: Skin is warm.  Neurological:     Mental Status: She is alert and oriented to person, place, and time.  Psychiatric:         Attention and Perception: Attention normal.        Mood and Affect: Mood normal.        Speech: Speech normal.        Behavior: Behavior normal. Behavior is cooperative.     Last depression screening scores    03/24/2022    9:06 AM 04/29/2018   10:39 AM 10/27/2017    8:38 AM  PHQ 2/9 Scores  PHQ - 2 Score 0  1 2  PHQ- 9 Score 2 3 9    Last fall risk screening    03/24/2022    9:06 AM  [place] in the past year? 0  Number falls in past yr: 0  Injury with Fall? 0  Risk for fall due to : No Fall Risks  Follow up Falls evaluation completed   Last Audit-C alcohol use screening    03/24/2022    9:06 AM  Alcohol Use Disorder Test (AUDIT)  1. How often do you have a drink containing alcohol? 2  2. How many drinks containing alcohol do you have on a typical day when you are drinking? 0  3. How often do you have six or more drinks on one occasion? 0  AUDIT-C Score 2   A score of 3 or more in women, and 4 or more in men indicates increased risk for alcohol abuse, EXCEPT if all of the points are from question 1   No results found for any visits on 03/24/22.  Assessment & Plan    Routine Health Maintenance and Physical Exam  Exercise Activities and Dietary recommendations --balanced diet high in fiber and protein, low in sugars, carbs, fats. --physical activity/exercise 30 minutes 3-5 times a week    Immunization History  Administered Date(s) Administered   DTaP 02/14/2013   Influenza,inj,Quad PF,6+ Mos 04/25/2015   Pfizer Covid-19 Vaccine Bivalent Booster 76yrs & up 03/22/2021   Tdap 07/15/2019    Health Maintenance  Topic Date Due   COLONOSCOPY (Pts 45-79yrs Insurance coverage will need to be confirmed)  Never done   Zoster Vaccines- Shingrix (1 of 2) Never done   INFLUENZA VACCINE  01/14/2022   PAP SMEAR-Modifier  04/16/2022 (Originally 04/21/2020)   MAMMOGRAM  06/01/2023   TETANUS/TDAP  07/14/2029   COVID-19 Vaccine  Completed   Hepatitis C Screening   Completed   HIV Screening  Completed   HPV VACCINES  Aged Out    Discussed health benefits of physical activity, and encouraged her to engage in regular exercise appropriate for her age and condition.  Problem List Items Addressed This Visit       Cardiovascular and Mediastinum   Migraines    Chronic and stable, < 15 migraines per month         Digestive   Gastroesophageal reflux disease without esophagitis    Stable, well controlled on prilosec      Relevant Medications   omeprazole (PRILOSEC) 40 MG capsule     Other   Vitamin B12 deficiency    Per pt; I do not see a recent vit b12 on chart --  Pt self injects monthly , refilled but will check vit b12      Relevant Medications   cyanocobalamin (VITAMIN B12) 1000 MCG/ML injection   Other Relevant Orders   Vitamin B12   Other Visit Diagnoses     Annual physical exam    -  Primary   Relevant Orders   Comprehensive metabolic panel   Lipid Panel With LDL/HDL Ratio   TSH   Colon cancer screening       Relevant Orders   Ambulatory referral to Gastroenterology      Pt gets PAP through GYN, mammo orders through gyn.   Return in about 1 year (around 03/25/2023) for CPE.     I, Mikey Kirschner, PA-C have reviewed all documentation for this visit. The documentation on  03/24/2022  for the exam, diagnosis, procedures, and orders are all accurate  and complete.  Mikey Kirschner, PA-C [facility] [address] [place], [place], [address] Office: [phone] Fax: [place]

## 2022-03-24 ENCOUNTER — Ambulatory Visit (INDEPENDENT_AMBULATORY_CARE_PROVIDER_SITE_OTHER): Payer: BC Managed Care – PPO | Admitting: Physician Assistant

## 2022-03-24 ENCOUNTER — Telehealth: Payer: Self-pay

## 2022-03-24 ENCOUNTER — Other Ambulatory Visit: Payer: Self-pay

## 2022-03-24 ENCOUNTER — Encounter: Payer: Self-pay | Admitting: Physician Assistant

## 2022-03-24 VITALS — BP 104/77 | HR 86 | Resp 14 | Ht 64.0 in | Wt 130.0 lb

## 2022-03-24 DIAGNOSIS — Z0000 Encounter for general adult medical examination without abnormal findings: Secondary | ICD-10-CM

## 2022-03-24 DIAGNOSIS — K219 Gastro-esophageal reflux disease without esophagitis: Secondary | ICD-10-CM | POA: Diagnosis not present

## 2022-03-24 DIAGNOSIS — Z1211 Encounter for screening for malignant neoplasm of colon: Secondary | ICD-10-CM | POA: Diagnosis not present

## 2022-03-24 DIAGNOSIS — Z23 Encounter for immunization: Secondary | ICD-10-CM

## 2022-03-24 DIAGNOSIS — G43809 Other migraine, not intractable, without status migrainosus: Secondary | ICD-10-CM

## 2022-03-24 DIAGNOSIS — G43909 Migraine, unspecified, not intractable, without status migrainosus: Secondary | ICD-10-CM | POA: Insufficient documentation

## 2022-03-24 DIAGNOSIS — E538 Deficiency of other specified B group vitamins: Secondary | ICD-10-CM

## 2022-03-24 MED ORDER — CYANOCOBALAMIN 1000 MCG/ML IJ SOLN: 1000.0000 ug | INTRAMUSCULAR | 3 refills | Status: AC

## 2022-03-24 MED ORDER — OMEPRAZOLE 40 MG PO CPDR: 40.0000 mg | DELAYED_RELEASE_CAPSULE | Freq: Every day | ORAL | 1 refills | Status: DC

## 2022-03-24 MED ORDER — NA SULFATE-K SULFATE-MG SULF 17.5-3.13-1.6 GM/177ML PO SOLN: 1.0000 | Freq: Once | ORAL | 0 refills | Status: AC

## 2022-03-24 NOTE — Assessment & Plan Note (Signed)
Per pt; I do not see a recent vit b12 on chart --  Pt self injects monthly , refilled but will check vit b12

## 2022-03-24 NOTE — Assessment & Plan Note (Signed)
Stable, well controlled on prilosec

## 2022-03-24 NOTE — Telephone Encounter (Signed)
Gastroenterology Pre-Procedure Review  Request Date: 05/16/22 Requesting Physician: Dr. Vicente Males  PATIENT REVIEW QUESTIONS: The patient responded to the following health history questions as indicated:    1. Are you having any GI issues? no 2. Do you have a personal history of Polyps? no 3. Do you have a family history of Colon Cancer or Polyps? no 4. Diabetes Mellitus? no 5. Joint replacements in the past 12 months?no 6. Major health problems in the past 3 months?no 7. Any artificial heart valves, MVP, or defibrillator?no    MEDICATIONS & ALLERGIES:    Patient reports the following regarding taking any anticoagulation/antiplatelet therapy:   Plavix, Coumadin, Eliquis, Xarelto, Lovenox, Pradaxa, Brilinta, or Effient? no Aspirin? no  Patient confirms/reports the following medications:  Current Outpatient Medications  Medication Sig Dispense Refill   acetaminophen (TYLENOL) 500 MG tablet Take 1,000 mg by mouth every 8 (eight) hours as needed for moderate pain.     cyanocobalamin (VITAMIN B12) 1000 MCG/ML injection Inject 1 mL (1,000 mcg total) into the muscle every 30 (thirty) days. 1 mL 3   MULTIPLE VITAMIN PO Take 1 tablet by mouth daily.     omeprazole (PRILOSEC) 40 MG capsule Take 1 capsule (40 mg total) by mouth daily. First thing in the morning on an empty stomach, 20-30 minutes before a meal 90 capsule 1   spironolactone (ALDACTONE) 50 MG tablet Take 1 tablet by mouth daily.     SUMAtriptan (IMITREX) 50 MG tablet TAKE 1 TABLET BY MOUTH EVERY 2 HRS AS NEEDED FOR MIGRAINE. REPEAT IN 2 HRS IF HEADACHE PERSISTS OR RECURS 9 tablet 1   No current facility-administered medications for this visit.    Patient confirms/reports the following allergies:  No Known Allergies  No orders of the defined types were placed in this encounter.   AUTHORIZATION INFORMATION Primary Insurance: 1D#: Group #:  Secondary Insurance: 1D#: Group #:  SCHEDULE INFORMATION: Date:  05/16/22 Time: Location: [facility]

## 2022-03-24 NOTE — Assessment & Plan Note (Signed)
Chronic and stable, < 15 migraines per month

## 2022-05-13 ENCOUNTER — Telehealth: Payer: Self-pay

## 2022-05-13 NOTE — Telephone Encounter (Signed)
Patient has requested to reschedule her colonoscopy currently scheduled with Dr. Lavell Anchors 05/16/22.  She requested to move it to the last Friday of January.  Rescheduled her to 07/11/22.  Trish in Endo has been informed of date change.  Referral updated.  New instructions have been sent.  Thanks,  [place], [place]

## 2022-05-15 ENCOUNTER — Other Ambulatory Visit: Payer: Self-pay | Admitting: Physician Assistant

## 2022-05-15 DIAGNOSIS — Z1231 Encounter for screening mammogram for malignant neoplasm of breast: Secondary | ICD-10-CM

## 2022-06-03 ENCOUNTER — Ambulatory Visit
Admission: RE | Admit: 2022-06-03 | Discharge: 2022-06-03 | Disposition: A | Discharge status: Home / Self Care | Payer: BC Managed Care – PPO | Source: Ambulatory Visit | Attending: Physician Assistant | Admitting: Physician Assistant

## 2022-06-03 DIAGNOSIS — Z1231 Encounter for screening mammogram for malignant neoplasm of breast: Secondary | ICD-10-CM | POA: Diagnosis present

## 2022-07-08 ENCOUNTER — Telehealth: Payer: Self-pay | Admitting: Gastroenterology

## 2022-07-08 NOTE — Telephone Encounter (Signed)
Pt canceled colonoscopy for 07/11/2022 and will call back to resch

## 2022-07-08 NOTE — Telephone Encounter (Signed)
Returned patients call to let her know her message was received to cancel her screening colonoscopy with Dr. Vicente Males for 07/11/22. She said she will be out of town.  Trish in Endo has been notified of cancellation.  Pt will call back to reschedule.  Thanks, [place], [place]

## 2022-07-11 ENCOUNTER — Ambulatory Visit
Admission: RE | Admit: 2022-07-11 | Payer: BC Managed Care – PPO | Source: Home / Self Care | Admitting: Gastroenterology

## 2022-07-11 ENCOUNTER — Encounter: Admission: RE | Payer: Self-pay | Source: Home / Self Care

## 2022-07-11 SURGERY — COLONOSCOPY WITH PROPOFOL
Anesthesia: General

## 2022-10-31 ENCOUNTER — Other Ambulatory Visit: Payer: Self-pay | Admitting: Physician Assistant

## 2022-10-31 DIAGNOSIS — K219 Gastro-esophageal reflux disease without esophagitis: Secondary | ICD-10-CM

## 2023-05-11 ENCOUNTER — Encounter: Payer: Self-pay | Admitting: Physician Assistant

## 2023-05-11 DIAGNOSIS — Z1231 Encounter for screening mammogram for malignant neoplasm of breast: Secondary | ICD-10-CM

## 2023-05-21 ENCOUNTER — Encounter: Payer: Self-pay | Admitting: Physician Assistant

## 2023-05-21 ENCOUNTER — Telehealth: Payer: Self-pay

## 2023-05-21 DIAGNOSIS — Z1231 Encounter for screening mammogram for malignant neoplasm of breast: Secondary | ICD-10-CM

## 2023-05-21 NOTE — Addendum Note (Signed)
Addended by: Charlcie Cradle on: 05/21/2023 05:11 PM   Modules accepted: Orders

## 2023-05-21 NOTE — Telephone Encounter (Signed)
Copied from CRM [id number]. Topic: General - Other >> May 21, 2023  9:48 AM Franchot Heidelberg wrote: Reason for CRM: Pt called reporting that she needs her mammogram orders resubmitted since Lillia Abed is no longer her PCP. Please advise if pt needs to see another provider to have this submitted.   Best contact: [phone]

## 2023-06-11 ENCOUNTER — Ambulatory Visit
Admission: RE | Admit: 2023-06-11 | Discharge: 2023-06-11 | Disposition: A | Discharge status: Home / Self Care | Payer: BC Managed Care – PPO | Source: Ambulatory Visit | Attending: Family Medicine | Admitting: Family Medicine

## 2023-06-11 DIAGNOSIS — Z1231 Encounter for screening mammogram for malignant neoplasm of breast: Secondary | ICD-10-CM | POA: Diagnosis present

## 2023-08-29 IMAGING — MG MM DIGITAL DIAGNOSTIC UNILAT*L* W/ TOMO W/ CAD
4 series · 4 of 12 positions shown · non-contrast
Comparison: Previous exam(s).

CLINICAL DATA: Callback for LEFT breast asymmetries

EXAM:
DIGITAL DIAGNOSTIC UNILATERAL LEFT MAMMOGRAM WITH TOMOSYNTHESIS AND
CAD
TECHNIQUE: Left digital diagnostic mammography and breast tomosynthesis was
performed. The images were evaluated with computer-aided detection.

[L ML synth-2D]
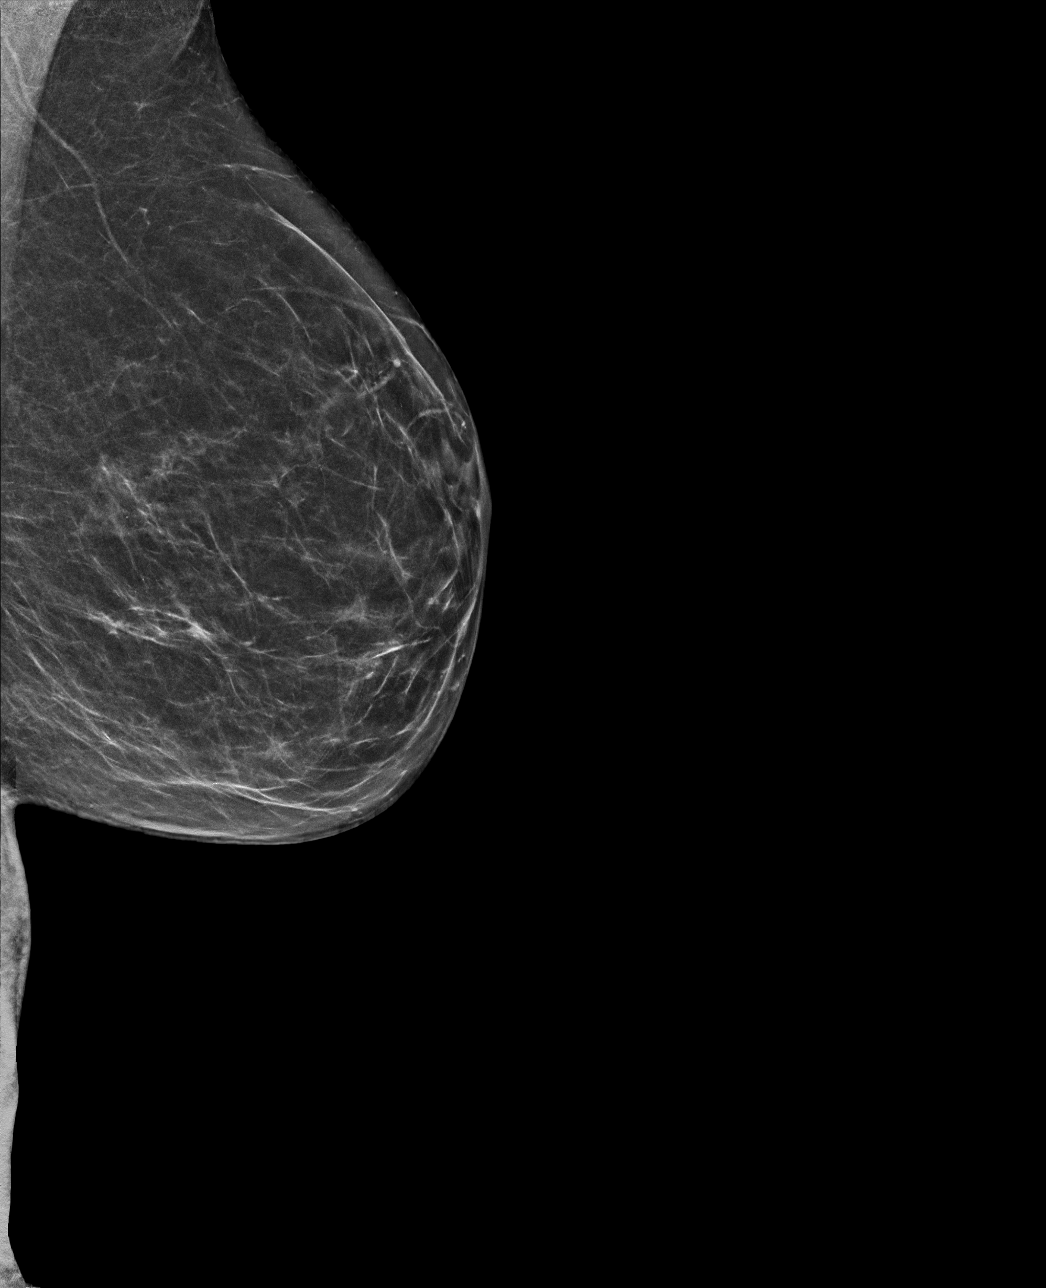

[L CC synth-2D]
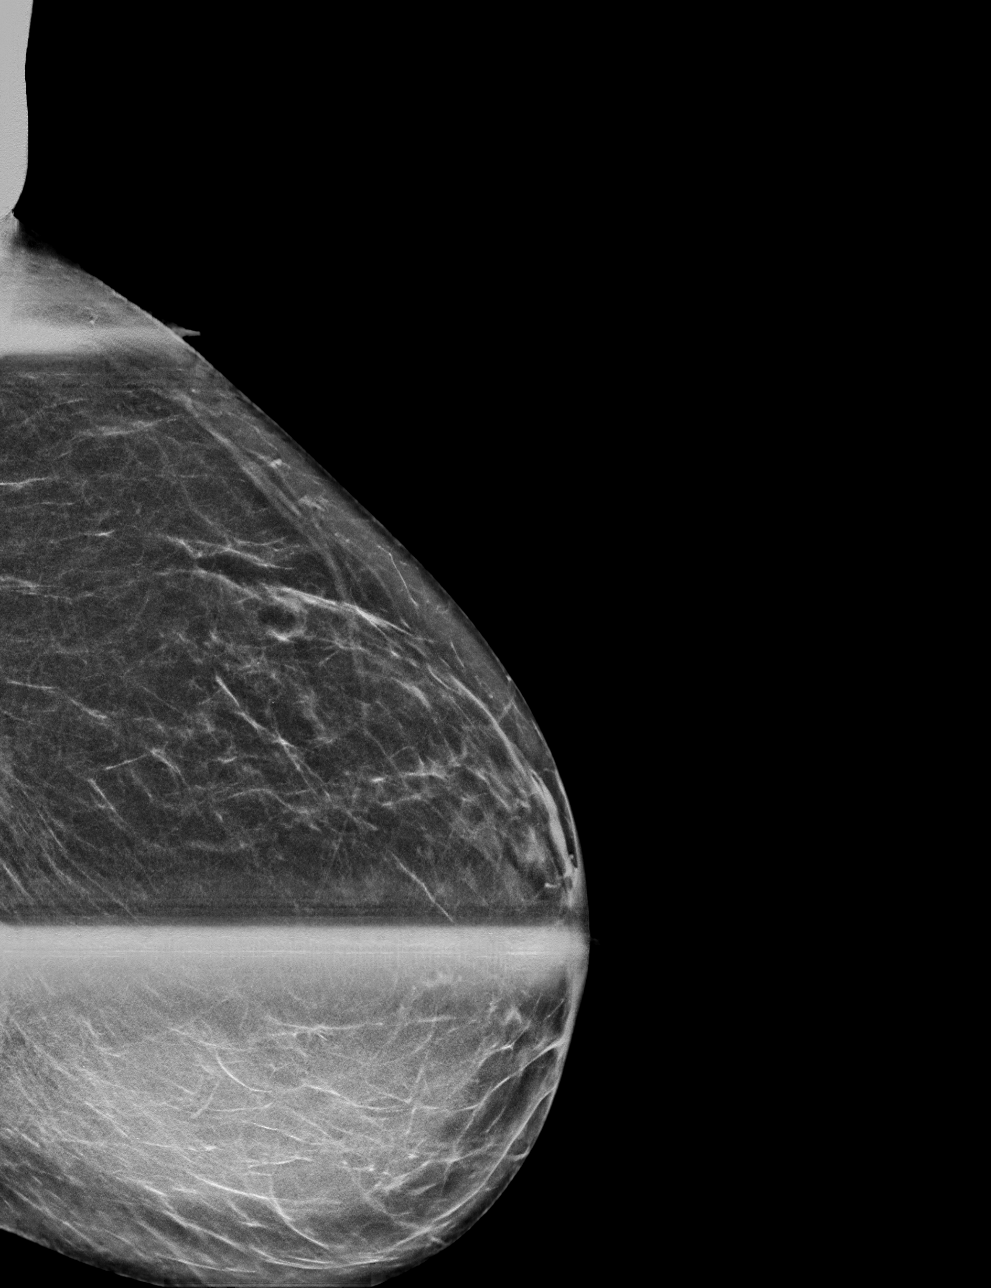

[L ML tomo · tomo slice 31/60.0]
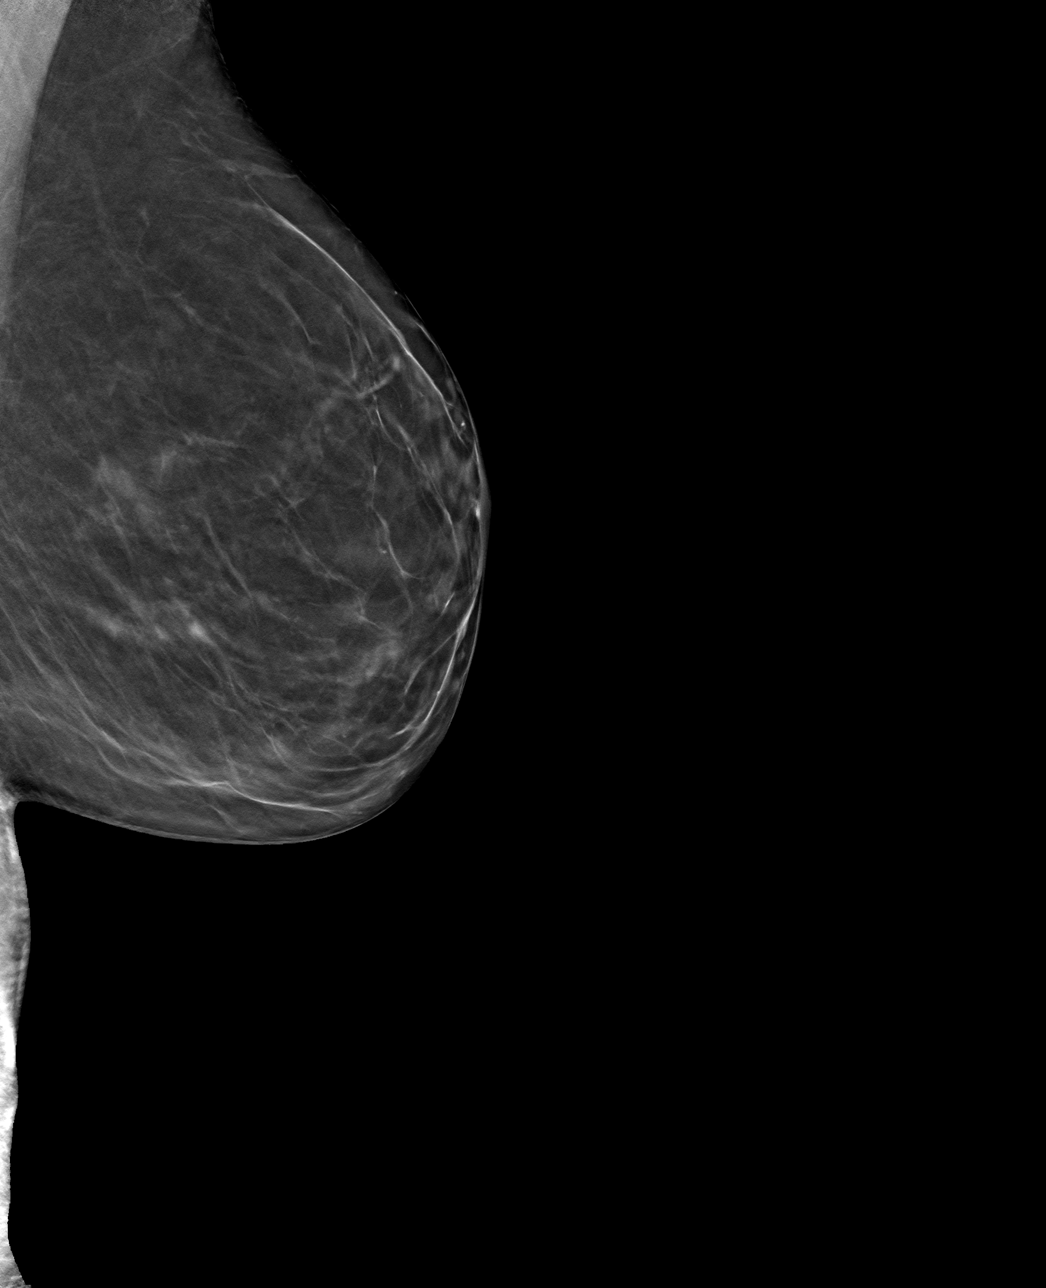

[L CC tomo · tomo slice 26/51.0]
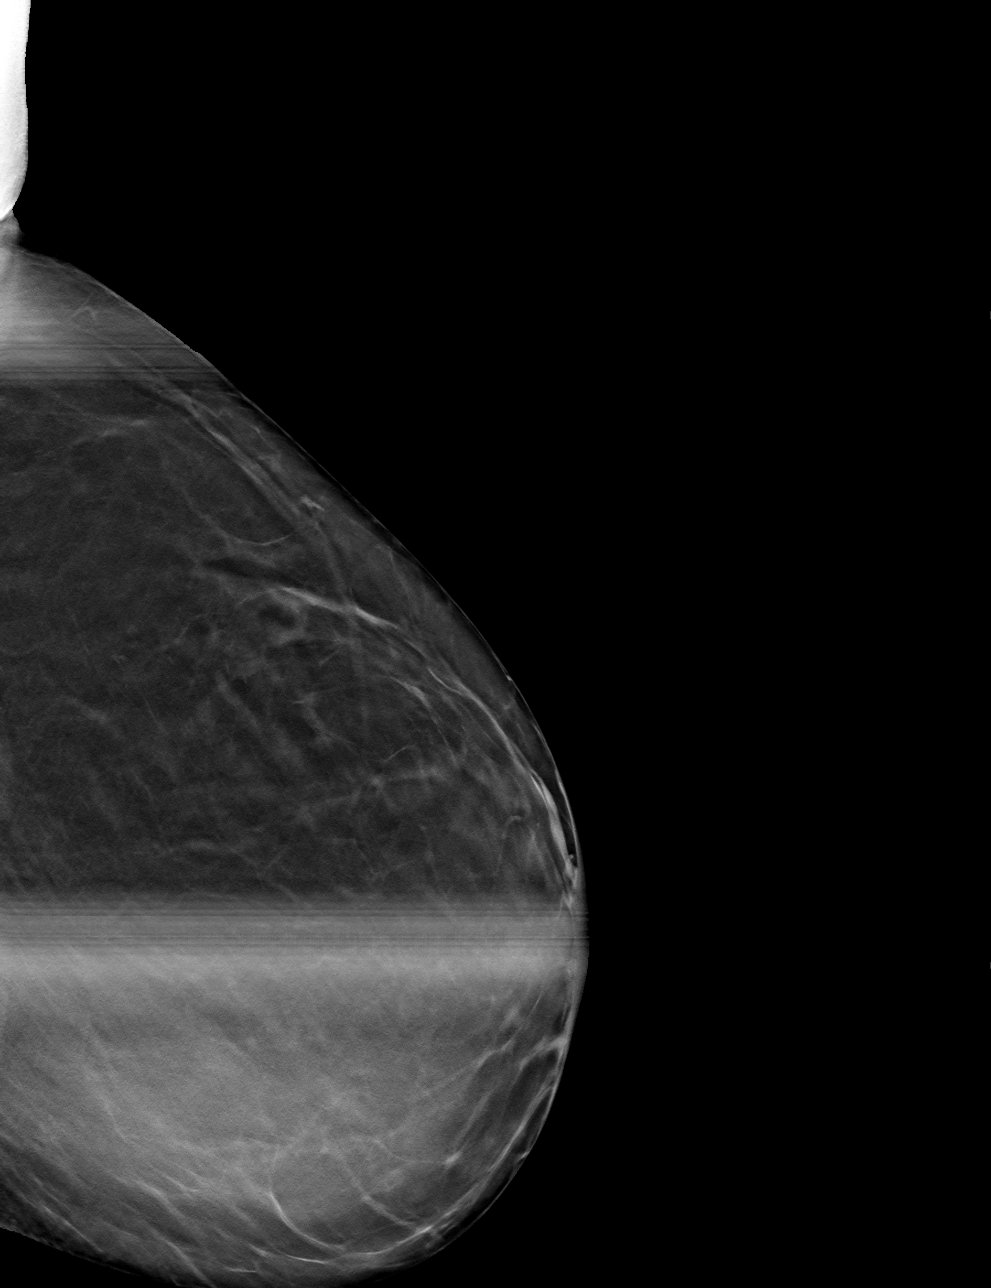

[4 of 12 positions shown; findings below may reference images not displayed]

ACR Breast Density Category b: There are scattered areas of
fibroglandular density.
FINDINGS: The previously described finding does not persist with additional
views, consistent with superimposed fibroglandular tissue. No
suspicious mass, microcalcification, or other finding is identified.
IMPRESSION: No mammographic evidence of malignancy.

RECOMMENDATION:
Screening mammogram in one year.(Code:[id number])

I have discussed the findings and recommendations with the patient.
If applicable, a reminder letter will be sent to the patient
regarding the next appointment.

BI-RADS CATEGORY  1: Negative.

## 2024-10-17 ENCOUNTER — Encounter: Admitting: Family Medicine
